# Patient Record
Sex: Male | Born: 1965 | Race: White | Hispanic: No | Marital: Married | Smoking: Current every day smoker
Health system: Southern US, Community
[De-identification: ages and names within clinical notes are randomized; demographics above are authoritative.]

## PROBLEM LIST (undated history)

## (undated) DIAGNOSIS — F1010 Alcohol abuse, uncomplicated: Secondary | ICD-10-CM

## (undated) DIAGNOSIS — I2510 Atherosclerotic heart disease of native coronary artery without angina pectoris: Secondary | ICD-10-CM

## (undated) DIAGNOSIS — Z720 Tobacco use: Secondary | ICD-10-CM

## (undated) DIAGNOSIS — I10 Essential (primary) hypertension: Secondary | ICD-10-CM

## (undated) HISTORY — PX: SHOULDER SURGERY: SHX246

## (undated) HISTORY — DX: Alcohol abuse, uncomplicated: F10.10

---

## 2003-03-09 ENCOUNTER — Emergency Department (HOSPITAL_COMMUNITY): Admission: EM | Admit: 2003-03-09 | Discharge: 2003-03-09 | Payer: Self-pay | Admitting: Emergency Medicine

## 2003-03-09 ENCOUNTER — Encounter: Payer: Self-pay | Admitting: Emergency Medicine

## 2004-10-01 ENCOUNTER — Observation Stay (HOSPITAL_COMMUNITY): Admission: RE | Admit: 2004-10-01 | Discharge: 2004-10-02 | Payer: Self-pay | Admitting: Specialist

## 2008-04-20 ENCOUNTER — Emergency Department (HOSPITAL_COMMUNITY): Admission: EM | Admit: 2008-04-20 | Discharge: 2008-04-20 | Payer: Self-pay | Admitting: Family Medicine

## 2009-05-05 ENCOUNTER — Ambulatory Visit (HOSPITAL_COMMUNITY): Admission: RE | Admit: 2009-05-05 | Discharge: 2009-05-05 | Payer: Self-pay | Admitting: Gastroenterology

## 2009-05-05 ENCOUNTER — Encounter (INDEPENDENT_AMBULATORY_CARE_PROVIDER_SITE_OTHER): Payer: Self-pay | Admitting: Gastroenterology

## 2011-03-05 NOTE — Op Note (Signed)
NAME:  Roger Davis, Roger Davis NO.:  [id number]   MEDICAL RECORD NO.:  [id number]          PATIENT TYPE:  AMB   LOCATION:  ENDO                         FACILITY:  [facility]   PHYSICIAN:  Shirley Friar, MDDATE OF BIRTH:  1966-10-10   DATE OF PROCEDURE:  05/05/2009  DATE OF DISCHARGE:                               OPERATIVE REPORT   PROCEDURE:  Colonoscopy.   ENDOSCOPIST:  Shirley Friar, M.D.   INDICATIONS FOR PROCEDURE:  Screening and a family history of colon  cancer in his father.   MEDICATIONS:  Fentanyl 100 mcg IV, Versed 12.5 mg IV, Phenergan 12.5 mg  IV.   DESCRIPTION OF PROCEDURE/FINDINGS:  The rectal exam was normal.  The  pediatric Pentax colonoscope was inserted into a well-prepped colon and  advanced to the cecum where the ileocecal valve and appendiceal orifice  were identified.  In the transverse colon, there was an 8 mm sessile  polyp, that was removed with snare and cautery.  No other mucosal  abnormalities were seen.  Retroflexion was unremarkable.   ASSESSMENT:  Colon polyp, status post snare and cautery, otherwise  normal colonoscopy.   PLAN:  1. Follow up on pathology.  2. No aspirin products for 14 days.  3. Repeat colonoscopy in five years.      Shirley Friar, MD  Electronically Signed     VCS/MEDQ  D:  05/05/2009  T:  05/05/2009  Job:  [id number]

## 2011-03-08 NOTE — Op Note (Signed)
NAME:  JASN, XIA NO.:  [id number]   MEDICAL RECORD NO.:  [id number]          PATIENT TYPE:  AMB   LOCATION:  DAY                          FACILITY:  [facility]   PHYSICIAN:  Jene Every, M.D.    DATE OF BIRTH:  October 18, 1966   DATE OF PROCEDURE:  10/01/2004  DATE OF DISCHARGE:                                 OPERATIVE REPORT   PREOPERATIVE DIAGNOSIS:  Dislocated right acromioclavicular joint.   POSTOPERATIVE DIAGNOSIS:  Dislocated right acromioclavicular joint.   PROCEDURE PERFORMED:  Open reduction of the distal clavicle joint, distal  clavicle resection, ligament reconstruction of the acromioclavicular joint.  Coracoacromial ligament fixation and repair.   ANESTHESIA:  General.   SURGEON:  Jene Every, M.D.   ASSISTANT:  Marlowe Kays, M.D.   BRIEF HISTORY/INDICATIONS:  This is a 45 year old who sustained a  dislocation of the [facility] joint with progressive cephalad migration with pain,  instability, and intermittent tenting of the skin.  Operative intervention  was indicated for repair of the torn ligament and relocation of the [facility]  joint.  Discussed distal clavicle resection, redirection of the CA ligament  into the distal clavicle as well as the use of the Mitek suture.  Anchors at  the base of the coracoid, as described recently, for augmentation of the  fixation of the coracoacromial ligaments.  Risks and benefits were  discussed, including bleeding, infection, damage to vascular structures,  recurrent tear, recurrent dislocation, anesthetic complication, etc.   TECHNIQUE:  With the patient in the supine beach-chair position after  adequate induction of general anesthesia and 1 gm of Kefzol, the right  shoulder and upper extremity was prepped and draped in the usual sterile  fashion.  An anterior curvilinear incision was then made over the [facility] joint,  anterior aspect of the clavicle.  Subcutaneous tissue was incised,  electrocautery was utilized to  achieve hemostasis.  The clavicle was found  to be piercing through the deltopectoral fascia.  We incised the [facility] joint.  Clear fluid was evacuated.  Meniscus was found to be dislocated.  The  meniscus was removed.  I superiorly ostially elevated the periosteum over  the distal clavicle.  Oscillating saw utilized and removed 1 cm to the  distal clavicle.  Next, the anterior deltoid was also found to be torn as  well.  The CA ligament was identified, detached from the anterior aspect of  the acromion, and a mattress suture placed in its distal aspect.  It was  freely mobilized, and the attachment at the base of the coracoid was  preserved.  I curetted a hollow in the distal clavicle.  Two drill holes  were placed in the anterior cortex superiorly of the distal clavicle.  We  had enough of the CA ligament that we could deliver it into the distal  clavicle with the clavicle reduced to its appropriate position.   Next, we identified the base of the coracoid, its medial and lateral extent,  as well as its base and its anterior extent.  The torn coracoacromial  ligaments were appreciated as well.  I placed a  drill hole through the  anterior cortex at the base of the coracoid in two separate areas,  approximately 1 cm apart, and inserted a Mitek suture anchor without  difficulty, buried the metal implant, and were still able to visualize the  metal implant.  After release from its delivery device, we pulled on both of  the sutures.  We were unable to displace the suture anchor, but again, could  visualize the suture anchor on both.  Therefore, we felt that this did not  indicate it had migrated to the other side of the coracoid.  This was at its  base.  We drilled a hole through the clavicle right above the coracoid at  its base, more towards the anterior aspect of the clavicle with a drill.  I  passed a suture limb from each of the Mitek suture anchors through that  center hole.  One of the  Ethibond sutures from the Mitek was placed  posterior to the clavicle, and the other anterior.  Next, the wound was  copiously irrigated.  We then held the clavicle in reduction such that the  cephalad edge of the clavicle was flushed with the cephalad edge of the  acromion.  We delivered the CA ligament into the distal clavicle and tied it  anteriorly with excellent purchase.  We then sutured the limbs of the Mitek  suture anchors anteriorly and posteriorly with excellent purchase.  We then  over-sewed some ends of the coracoacromial ligaments anteriorly to the torn  portion at the clavicle.  Following this, I repaired the capsule over the [facility]  joint with #1 Vicryl figure-of-eight interrupted sutures.  I also repaired  the deltotrapezial fascia.  The periosteum of the clavicle was repaired as  well as well as the anterior deltoid attachment on the anterior aspect of  the clavicle.  Excellent repair was noted.  Range of the shoulder without  change in the position of the clavicle.  The wound was copiously irrigated.  Subcutaneous tissue was reapproximated was 2-0 Vicryl sutures.  The skin was  reapproximated with staples.  Obtained an intraoperative radiograph digital  film, somewhat from an oblique view.  The clavicle appeared to be level.  Difficult to see the anchors of the Mitek suture anchor, but again, we felt  we visualized that intraoperatively and felt it was well within the base of  the coracoid.  Next, the sterile dressing was applied.  He was placed in an  abduction pillow, extubated without difficulty, and transported to the  recovery room in satisfactory condition.  Patient had tolerated the  procedure well.  There were no complications.  Blood loss was 10 cc.     Trey Paula   JB/MEDQ  D:  10/01/2004  T:  10/01/2004  Job:  [id number]

## 2014-09-11 ENCOUNTER — Encounter (HOSPITAL_COMMUNITY): Payer: Self-pay

## 2014-09-11 ENCOUNTER — Emergency Department (HOSPITAL_COMMUNITY)
Admission: EM | Admit: 2014-09-11 | Discharge: 2014-09-11 | Disposition: A | Discharge status: Home / Self Care | Payer: PRIVATE HEALTH INSURANCE | Attending: Emergency Medicine | Admitting: Emergency Medicine

## 2014-09-11 DIAGNOSIS — R000 Tachycardia, unspecified: Secondary | ICD-10-CM | POA: Diagnosis not present

## 2014-09-11 DIAGNOSIS — L02215 Cutaneous abscess of perineum: Secondary | ICD-10-CM | POA: Insufficient documentation

## 2014-09-11 DIAGNOSIS — I10 Essential (primary) hypertension: Secondary | ICD-10-CM | POA: Insufficient documentation

## 2014-09-11 DIAGNOSIS — Z720 Tobacco use: Secondary | ICD-10-CM | POA: Insufficient documentation

## 2014-09-11 DIAGNOSIS — K6289 Other specified diseases of anus and rectum: Secondary | ICD-10-CM | POA: Diagnosis present

## 2014-09-11 HISTORY — DX: Essential (primary) hypertension: I10

## 2014-09-11 LAB — BASIC METABOLIC PANEL
Anion gap: 16 — ABNORMAL HIGH (ref 5–15)
BUN: 8 mg/dL (ref 6–23)
CO2: 23 mEq/L (ref 19–32)
Calcium: 9.5 mg/dL (ref 8.4–10.5)
Chloride: 95 mEq/L — ABNORMAL LOW (ref 96–112)
Creatinine, Ser: 0.7 mg/dL (ref 0.50–1.35)
GFR calc Af Amer: >90 mL/min (ref >90)
GFR calc non Af Amer: >90 mL/min (ref >90)
Glucose, Bld: 89 mg/dL (ref 70–99)
Potassium: 4.3 mEq/L (ref 3.7–5.3)
Sodium: 134 mEq/L — ABNORMAL LOW (ref 137–147)

## 2014-09-11 LAB — CBC WITH DIFFERENTIAL/PLATELET
Basophils Absolute: 0.1 10*3/uL (ref 0.0–0.1)
Basophils Relative: 1 % (ref 0–1)
Eosinophils Absolute: 0.1 10*3/uL (ref 0.0–0.7)
Eosinophils Relative: 1 % (ref 0–5)
HEMATOCRIT: 42.8 % (ref 39.0–52.0)
Hemoglobin: 15.3 g/dL (ref 13.0–17.0)
Lymphocytes Relative: 13 % (ref 12–46)
Lymphs Abs: 1.4 10*3/uL (ref 0.7–4.0)
MCH: 33.2 pg (ref 26.0–34.0)
MCHC: 35.7 g/dL (ref 30.0–36.0)
MCV: 92.8 fL (ref 78.0–100.0)
Monocytes Absolute: 1.3 10*3/uL — ABNORMAL HIGH (ref 0.1–1.0)
Monocytes Relative: 12 % (ref 3–12)
NEUTROS ABS: 8.1 10*3/uL — AB (ref 1.7–7.7)
Neutrophils Relative %: 73 % (ref 43–77)
Platelets: 226 10*3/uL (ref 150–400)
RBC: 4.61 MIL/uL (ref 4.22–5.81)
RDW: 11.8 % (ref 11.5–15.5)
WBC: 10.9 10*3/uL — ABNORMAL HIGH (ref 4.0–10.5)

## 2014-09-11 MED ORDER — OXYCODONE-ACETAMINOPHEN 5-325 MG PO TABS: 1.0000 | ORAL_TABLET | Freq: Four times a day (QID) | ORAL | Status: AC | PRN

## 2014-09-11 MED ORDER — CLINDAMYCIN PHOSPHATE 300 MG/50ML IV SOLN
300.0000 mg | Freq: Once | INTRAVENOUS | Status: AC
  Administered 2014-09-11: 300 mg via INTRAVENOUS
  Filled 2014-09-11 (×2): qty 50

## 2014-09-11 MED ORDER — NAPROXEN 500 MG PO TABS: 500.0000 mg | ORAL_TABLET | Freq: Two times a day (BID) | ORAL | Status: AC | PRN

## 2014-09-11 MED ORDER — SODIUM CHLORIDE 0.9 % IV BOLUS (SEPSIS)
1000.0000 mL | Freq: Once | INTRAVENOUS | Status: AC
  Administered 2014-09-11: 1000 mL via INTRAVENOUS

## 2014-09-11 MED ORDER — CLINDAMYCIN HCL 300 MG PO CAPS: 300.0000 mg | ORAL_CAPSULE | Freq: Four times a day (QID) | ORAL | Status: DC

## 2014-09-11 MED ORDER — HYDROMORPHONE HCL 1 MG/ML IJ SOLN
1.0000 mg | Freq: Once | INTRAMUSCULAR | Status: AC
  Administered 2014-09-11: 1 mg via INTRAVENOUS
  Filled 2014-09-11: qty 1

## 2014-09-11 MED ORDER — LIDOCAINE-EPINEPHRINE (PF) 2 %-1:200000 IJ SOLN
10.0000 mL | Freq: Once | INTRAMUSCULAR | Status: AC
  Administered 2014-09-11: 10 mL
  Filled 2014-09-11: qty 20

## 2014-09-11 NOTE — ED Notes (Signed)
Pt reports a "boil" to the L perirectal area.  Pt reports it's about the size of a quarter and very painful.  Pt denies any drainage from the area.

## 2014-09-11 NOTE — ED Provider Notes (Signed)
CSN: [id number]     Arrival date & time 09/11/14  1128 History   First MD Initiated Contact with Patient 09/11/14 1305     Chief Complaint  Patient presents with  . Rectal Pain     (Consider location/radiation/quality/duration/timing/severity/associated sxs/prior Treatment) HPI Comments: Roger Davis is a 48 y.o. male with a PMHx of HTN, who presents to the ED with complaints of abscess to left buttock near perineum onset 1 week ago. He reports the pain is 8/10 pressure-like constant nonradiating pain worse with sitting or palpation to the area and improved with standing. He has attempted to perform warm soaks with no relief. He has not tried any medications. He reports the area has become fluctuant and increased in size as well as erythema, but denies any drainage or red streaking. He denies any fevers, chills, chest pain, shortness breath, abdominal pain, nausea, vomiting, diarrhea, constipation, dysuria, hematuria, groin pain, testicular pain or swelling, scrotal swelling or pain, rectal pain or pain with defecation, hematochezia, melena, or paresthesias. He has never had any prior abscesses, and denies any known injury to this area. Denies any insect bites or IV drug use. Denies diabetes or immunosuppression. Of note he did not take his hypertension medications today. PCP Lupita RaiderKimberlee Shaw.  Patient is a 48 y.o. male presenting with abscess. The history is provided by the patient. No language interpreter was used.  Abscess Location:  Ano-genital Ano-genital abscess location:  L buttock, gluteal cleft and perineum Size:  Quarter sized Abscess quality: fluctuance, painful and redness   Abscess quality: not draining, no induration, no warmth and not weeping   Red streaking: no   Duration:  1 week Progression:  Worsening Pain details:    Quality:  Pressure   Severity:  Severe (8/10)   Duration:  1 week   Timing:  Constant   Progression:  Worsening Chronicity:  New Context: not diabetes,  not immunosuppression, not injected drug use and not insect bite/sting   Relieved by: standing. Exacerbated by: sitting or palpation to area. Ineffective treatments:  Warm water soaks Associated symptoms: no anorexia, no fever, no nausea and no vomiting   Risk factors: no prior abscess     Past Medical History  Diagnosis Date  . Hypertension    Past Surgical History  Procedure Laterality Date  . Shoulder surgery     No family history on file. History  Substance Use Topics  . Smoking status: Current Every Day Smoker  . Smokeless tobacco: Not on file  . Alcohol Use: Yes    Review of Systems  Constitutional: Negative for fever and chills.  Respiratory: Negative for shortness of breath.   Cardiovascular: Negative for chest pain.  Gastrointestinal: Negative for nausea, vomiting, abdominal pain, diarrhea, constipation, blood in stool, anal bleeding, rectal pain and anorexia.  Genitourinary: Negative for dysuria, hematuria, scrotal swelling, difficulty urinating, penile pain and testicular pain.  Musculoskeletal: Negative for myalgias and arthralgias.  Skin: Positive for color change.       +abscess  Allergic/Immunologic: Negative for immunocompromised state.  Neurological: Negative for weakness and numbness.  Hematological: Does not bruise/bleed easily.   10 Systems reviewed and are negative for acute change except as noted in the HPI.    Allergies  Review of patient's allergies indicates no known allergies.  Home Medications   Prior to Admission medications   Not on File   BP 154/101 mmHg  Pulse 103  Temp(Src) 98.3 F (36.8 C)  Resp 18  Ht 5\' 10"  (1.778  m)  Wt 170 lb (77.111 kg)  BMI 24.39 kg/m2  SpO2 97% Physical Exam  Constitutional: He is oriented to person, place, and time. He appears well-developed and well-nourished.  Non-toxic appearance. He appears distressed (in pain).  Hypertension noted, mild tachycardia, appears uncomfortable but nontoxic. Afebrile.    HENT:  Head: Normocephalic and atraumatic.  Mouth/Throat: Oropharynx is clear and moist and mucous membranes are normal.  Eyes: Conjunctivae and EOM are normal. Right eye exhibits no discharge. Left eye exhibits no discharge.  Neck: Normal range of motion. Neck supple.  Cardiovascular: Regular rhythm, normal heart sounds and intact distal pulses.  Tachycardia present.  Exam reveals no gallop and no friction rub.   No murmur heard. Mildly tachycardic initially which resolved  Pulmonary/Chest: Effort normal and breath sounds normal. No respiratory distress. He has no decreased breath sounds. He has no wheezes. He has no rhonchi. He has no rales.  Abdominal: Soft. Normal appearance and bowel sounds are normal. He exhibits no distension. There is no tenderness. There is no rigidity, no rebound, no guarding, no CVA tenderness, no tenderness at McBurney's point and negative Murphy's sign.  Genitourinary: Testes normal and penis normal. Rectal exam shows no external hemorrhoid, no internal hemorrhoid, no fissure, no mass and no tenderness. Right testis shows no mass, no swelling and no tenderness. Left testis shows no mass, no swelling and no tenderness.     No gross blood on rectal exam. No rectal tenderness, masses, hemorrhoids, or fissure. Large abscess to L buttock just lateral to anus, fluctuant, approx 6cm x 3cm with erythema extending to the borders but not beyond, no surrounding cellulitis. Mildly warm, exquisitely TTP, without extension to scrotum or rectum.  Musculoskeletal: Normal range of motion.  Neurological: He is alert and oriented to person, place, and time. He has normal strength. No sensory deficit.  Skin: Skin is warm, dry and intact. No rash noted. There is erythema.  Abscess as noted above to L buttock. No drainage.  Psychiatric: He has a normal mood and affect.  Nursing note and vitals reviewed.   ED Course  INCISION AND DRAINAGE Date/Time: 09/11/2014 2:36 PM Performed by:  Marjean DonnaAMPRUBI-SOMS, Ileta Ofarrell STRUPP Authorized by: Ramond MarrowAMPRUBI-SOMS, Suetta Hoffmeister STRUPP Consent: Verbal consent obtained. Risks and benefits: risks, benefits and alternatives were discussed Consent given by: patient Patient understanding: patient states understanding of the procedure being performed Patient consent: the patient's understanding of the procedure matches consent given Site marked: the operative site was marked Patient identity confirmed: verbally with patient Type: abscess Body area: anogenital Location details: perineum Anesthesia: local infiltration Local anesthetic: lidocaine 2% with epinephrine Anesthetic total: 7 ml Patient sedated: no Scalpel size: 11 Needle gauge: 22 Incision type: single straight Complexity: simple Drainage: purulent Drainage amount: moderate Wound treatment: wound left open Packing material: none Patient tolerance: Patient tolerated the procedure well with no immediate complications   (including critical care time) Labs Review Labs Reviewed  CBC WITH DIFFERENTIAL - Abnormal; Notable for the following:    WBC 10.9 (*)    Neutro Abs 8.1 (*)    Monocytes Absolute 1.3 (*)    All other components within normal limits  BASIC METABOLIC PANEL - Abnormal; Notable for the following:    Sodium 134 (*)    Chloride 95 (*)    Anion gap 16 (*)    All other components within normal limits  WOUND CULTURE    Imaging Review No results found.   EKG Interpretation None      MDM  Final diagnoses:  Abscess of superficial perineal space  HTN (hypertension), benign    48y/o male with abscess near perineum/L buttock. Fairly well circumscribed, very fluctuant, doesn't seem to extend into rectum or near scrotal area. Concerned for perirectal abscess vs fornier's gangrene, therefore discussed case with Dr. Rubin Payor who agrees that this is abscess and doesn't seem to be deeper space infection or more concerning abscess. Will proceed with obtaining basic labs,  give pain meds, and I&D with culture. Will give IV abx now and d/c home with abx.   3:06 PM CBC w/diff showing mild leukocytosis of neutrophilic predominance. BMP showing mildly low Na and Cl, therefore will give IVFs with clinda dose. I&D with moderate amount of purulent drainage, and pain improved drastically with procedure as well as dilaudid. Left wound open without packing due to concern of contamination from fecal material. Wound culture sent. Pain improvement causing HTN to resolve and tachycardia to improve. Doubt need for admission or surgery consult. Discussed warm sitz baths, wound care, pain control, and wound check in 2 days at PCP's office. Will rx clindamycin for home. Will give pain meds for home. Awaiting IV abx to finish, and nursing staff will discharge afterwards. I explained the diagnosis and have given explicit precautions to return to the ER including for any other new or worsening symptoms. The patient understands and accepts the medical plan as it's been dictated and I have answered their questions. Discharge instructions concerning home care and prescriptions have been given. The patient is STABLE and is discharged to home in good condition.  BP 150/92 mmHg  Pulse 90  Temp(Src) 98.3 F (36.8 C)  Resp 18  Ht 5\' 10"  (1.778 m)  Wt 170 lb (77.111 kg)  BMI 24.39 kg/m2  SpO2 98%  Meds ordered this encounter  Medications  . simvastatin (ZOCOR) 20 MG tablet    Sig: Take 20 mg by mouth daily.  Marland Kitchen atenolol (TENORMIN) 50 MG tablet    Sig: Take 50 mg by mouth daily.  Marland Kitchen lisinopril (PRINIVIL,ZESTRIL) 20 MG tablet    Sig: Take 20 mg by mouth daily.  Marland Kitchen HYDROmorphone (DILAUDID) injection 1 mg    Sig:   . lidocaine-EPINEPHrine (XYLOCAINE W/EPI) 2 %-1:200000 (PF) injection 10 mL    Sig:   . clindamycin (CLEOCIN) IVPB 300 mg    Sig:     Order Specific Question:  Antibiotic Indication:    Answer:  Cellulitis  . sodium chloride 0.9 % bolus 1,000 mL    Sig:      Donnita Falls  Camprubi-Soms, PA-C 09/11/14 1549  Nathan R. Rubin Payor, MD 09/11/14 [phone]

## 2014-09-11 NOTE — Discharge Instructions (Signed)
Keep wound clean and dry with gauze bandage. Apply warm compresses to affected area throughout the day, and four times daily perform warm sitz baths as described below, using warm soapy water to cleanse the area and then dry well before replacing bandage. Take antibiotic until it is finished. Take naprosyn and percocet as directed, as needed for pain but do not drive or operate machinery with pain medication use. Followup with your Primary Care doctor in 2 days for wound recheck. Monitor area for signs of infection to include, but not limited to: increasing pain, redness, drainage/pus, or swelling. Return to emergency department for emergent changing or worsening symptoms.   Perianal Abscess An abscess is an infected area that contains a collection of pus and debris. A perianal abscess is one that occurs in the perineal area, which is the area between the anus and the scrotum in males and between the anus and the vagina in females. Perianal abscesses can vary in size. Without treatment, a perianal abscess can become larger and cause other problems. CAUSES  Glands in the perineal area can become plugged up with debris. When this happens, an abscess may form.  SIGNS AND SYMPTOMS  The most common symptoms of a perianal abscess are:  Swelling and redness in the area of the abscess. The redness may go beyond the abscess and appear as a red streak on the skin.  Pain in the area of the abscess. Other possible symptoms include:   A visible lump or a lump that can be felt when touching the area and is usually painful.  Bleeding or pus-like discharge from the area.  Fever.  General weakness. DIAGNOSIS  Your health care provider will take a medical history and examine the area. This may involve examining the rectal area with a gloved hand (digital rectal exam). For women, it may require a careful vaginal exam. Sometimes, the health care provider needs to look into the rectum using a probe or  scope. TREATMENT  Treatment often requires making a cut (incision) in the abscess to drain the pus. This can sometimes be done in your health care provider's office or an emergency department after giving you medicine to numb the area (local anesthetic). For larger or deeper abscesses, surgery may be required to drain the abscess. Antibiotic medicines are sometimes given if there is infection of the surrounding tissue (cellulitis). In some cases, gauze is packed into the abscess to continue draining the area. Frequent sitz baths may be recommended to help the wound heal and to reduce the chance of the abscess coming back. HOME CARE INSTRUCTIONS   Only take over-the-counter or prescription medicines for pain, fever, or discomfort as directed by your health care provider.  Take antibiotic medicine as directed. Make sure you finish it even if you start to feel better.  If gauze is used in the abscess, follow your health care provider's instructions for removing or changing the gauze. It can usually be removed in 2-3 days.  If one or more drains have been placed in the abscess cavity, be careful not to pull at them. Your health care provider will tell you how long they need to remain in place.  Take warm sitz baths 3-4 times a day and after bowel movements. This will help reduce pain and swelling.  Keep the skin around the abscess clean and dry. Avoid cleaning the area too much.  Avoid scratching the abscess area.  Avoid using colored or perfumed toilet papers. SEEK MEDICAL CARE IF:  You have trouble having a bowel movement or passing urine.  Your pain or swelling in the affected area does not seem to be improving.  The gauze packing or the drains come out before the planned time. SEEK IMMEDIATE MEDICAL CARE IF:   You have problems moving or using your legs.  You have severe or increasing pain.  Your swelling in the affected area suddenly gets worse.  You have a large increase in  bleeding or passing of pus.  You have chills or a fever. MAKE SURE YOU:   Understand these instructions.  Will watch your condition.  Will get help right away if you are not doing well or get worse. Document Released: 11/13/2006 Document Revised: 07/28/2013 Document Reviewed: 05/19/2013 [facility] Patient Information 2015 [employer], [place]. This information is not intended to replace advice given to you by your health care provider. Make sure you discuss any questions you have with your health care provider.   Abscess Care After An abscess (also called a boil or furuncle) is an infected area that contains a collection of pus. Signs and symptoms of an abscess include pain, tenderness, redness, or hardness, or you may feel a moveable soft area under your skin. An abscess can occur anywhere in the body. The infection may spread to surrounding tissues causing cellulitis. A cut (incision) by the surgeon was made over your abscess and the pus was drained out. Gauze may have been packed into the space to provide a drain that will allow the cavity to heal from the inside outwards. The boil may be painful for 5 to 7 days. Most people with a boil do not have high fevers. Your abscess, if seen early, may not have localized, and may not have been lanced. If not, another appointment may be required for this if it does not get better on its own or with medications. HOME CARE INSTRUCTIONS   Only take over-the-counter or prescription medicines for pain, discomfort, or fever as directed by your caregiver.  When you bathe, soak and then remove gauze or iodoform packs at least daily or as directed by your caregiver. You may then wash the wound gently with mild soapy water. Repack with gauze or do as your caregiver directs. SEEK IMMEDIATE MEDICAL CARE IF:   You develop increased pain, swelling, redness, drainage, or bleeding in the wound site.  You develop signs of generalized infection including muscle aches,  chills, fever, or a general ill feeling.  An oral temperature above 102 F (38.9 C) develops, not controlled by medication. See your caregiver for a recheck if you develop any of the symptoms described above. If medications (antibiotics) were prescribed, take them as directed. Document Released: 04/25/2005 Document Revised: 12/30/2011 Document Reviewed: 12/21/2007 [facility] CenterExit[facility] Patient Information 2015 [place], [place]. This information is not intended to replace advice given to you by your health care provider. Make sure you discuss any questions you have with your health care provider.

## 2014-09-14 LAB — WOUND CULTURE

## 2014-09-15 NOTE — Progress Notes (Signed)
ED Antimicrobial Stewardship Positive Culture Follow Up   Roger Davis is an 48 y.o. male who presented to [facility] on 09[facility]11/2014 with a chief complaint of  Chief Complaint  Patient presents with  . Rectal Pain    Recent Results (from the past 720 hour(s))  Wound culture     Status: None   Collection Time: 09/11/14  2:47 PM  Result Value Ref Range Status   Specimen Description ABSCESS GROIN  Final   Special Requests NONE  Final   Gram Stain   Final    FEW WBC PRESENT,BOTH PMN AND MONONUCLEAR NO SQUAMOUS EPITHELIAL CELLS SEEN FEW GRAM NEGATIVE RODS Performed at [facility]    Culture   Final    MODERATE ESCHERICHIA COLI Performed at [facility]    Report Status 09/14/2014 FINAL  Final   Organism ID, Bacteria ESCHERICHIA COLI  Final      Susceptibility   Escherichia coli - MIC*    AMPICILLIN 8 SENSITIVE Sensitive     AMPICILLIN/SULBACTAM 4 SENSITIVE Sensitive     CEFAZOLIN <=4 SENSITIVE Sensitive     CEFEPIME <=1 SENSITIVE Sensitive     CEFTAZIDIME <=1 SENSITIVE Sensitive     CEFTRIAXONE <=1 SENSITIVE Sensitive     CIPROFLOXACIN <=0.25 SENSITIVE Sensitive     GENTAMICIN <=1 SENSITIVE Sensitive     IMIPENEM <=0.25 SENSITIVE Sensitive     PIP/TAZO <=4 SENSITIVE Sensitive     TOBRAMYCIN <=1 SENSITIVE Sensitive     TRIMETH/SULFA <=20 SENSITIVE Sensitive     * MODERATE ESCHERICHIA COLI    [x]  Treated with clindamycin, organism resistant to prescribed antimicrobial []  Patient discharged originally without antimicrobial agent and treatment is now indicated  New antibiotic prescription: cephalexin 500mg  PO QID x 7 days  ED Provider: Sharilyn SitesLisa Sanders, PA-C   Mickeal SkinnerFrens, [name] 09/15/2014, 9:34 AM Infectious Diseases Pharmacist Phone# [phone]

## 2014-09-17 ENCOUNTER — Telehealth: Payer: Self-pay | Admitting: Emergency Medicine

## 2014-09-20 ENCOUNTER — Telehealth (HOSPITAL_BASED_OUTPATIENT_CLINIC_OR_DEPARTMENT_OTHER): Payer: Self-pay | Admitting: Emergency Medicine

## 2014-09-20 NOTE — Telephone Encounter (Signed)
Post ED Visit - Positive Culture Follow-up: Successful Patient Follow-Up  Culture assessed and recommendations reviewed by: []  Wes Dulaney, Pharm.D., BCPS [x]  Celedonio MiyamotoJeremy [name], Pharm.D., BCPS []  Georgina PillionElizabeth Martin, Pharm.D., BCPS []  DuncanMinh Pham, [address] Rainbow BoulevardPharm.D., BCPS, AAHIVP []  Estella HuskMichelle Turner, Pharm.D., BCPS, AAHIVP []  Red ChristiansSamson Lee, Pharm.D. []  Cassie New Marsh[place]Stewart, [place]arm.D.  Positive wound culture  []  Patient discharged without antimicrobial prescription and treatment is now indicated [x]  Organism is resistant to prescribed ED discharge antimicrobial []  Patient with positive blood cultures  Changes discussed with ED provider: Sharilyn SitesLisa Sanders PA New antibiotic prescription Cephalexin 500mg  po qid x 7 days [facility] [address]  Geradine Girtontacted patient, date 09/20/14, time 1047   Berle MullMiller, Sayaka Hoeppner 09/20/2014, 10:46 AM

## 2015-01-29 ENCOUNTER — Encounter (HOSPITAL_COMMUNITY)
Admission: EM | Disposition: A | Discharge status: Home / Self Care | Payer: Self-pay | Source: Home / Self Care | Attending: Cardiovascular Disease

## 2015-01-29 ENCOUNTER — Inpatient Hospital Stay (HOSPITAL_COMMUNITY)
Admission: EM | Admit: 2015-01-29 | Discharge: 2015-01-30 | DRG: 287 | Disposition: A | Discharge status: Home / Self Care | Payer: PRIVATE HEALTH INSURANCE | Attending: Cardiovascular Disease | Admitting: Cardiovascular Disease

## 2015-01-29 ENCOUNTER — Encounter (HOSPITAL_COMMUNITY): Payer: Self-pay | Admitting: Emergency Medicine

## 2015-01-29 ENCOUNTER — Emergency Department (HOSPITAL_COMMUNITY): Payer: PRIVATE HEALTH INSURANCE

## 2015-01-29 ENCOUNTER — Other Ambulatory Visit: Payer: Self-pay

## 2015-01-29 DIAGNOSIS — I213 ST elevation (STEMI) myocardial infarction of unspecified site: Secondary | ICD-10-CM

## 2015-01-29 DIAGNOSIS — R52 Pain, unspecified: Secondary | ICD-10-CM | POA: Diagnosis present

## 2015-01-29 DIAGNOSIS — R0789 Other chest pain: Secondary | ICD-10-CM | POA: Diagnosis present

## 2015-01-29 DIAGNOSIS — R072 Precordial pain: Secondary | ICD-10-CM | POA: Diagnosis not present

## 2015-01-29 DIAGNOSIS — Z720 Tobacco use: Secondary | ICD-10-CM | POA: Diagnosis present

## 2015-01-29 DIAGNOSIS — M25462 Effusion, left knee: Secondary | ICD-10-CM

## 2015-01-29 DIAGNOSIS — I2510 Atherosclerotic heart disease of native coronary artery without angina pectoris: Principal | ICD-10-CM | POA: Diagnosis present

## 2015-01-29 DIAGNOSIS — F17210 Nicotine dependence, cigarettes, uncomplicated: Secondary | ICD-10-CM | POA: Diagnosis present

## 2015-01-29 DIAGNOSIS — R9431 Abnormal electrocardiogram [ECG] [EKG]: Secondary | ICD-10-CM | POA: Diagnosis present

## 2015-01-29 DIAGNOSIS — L03116 Cellulitis of left lower limb: Secondary | ICD-10-CM

## 2015-01-29 DIAGNOSIS — L03115 Cellulitis of right lower limb: Secondary | ICD-10-CM

## 2015-01-29 DIAGNOSIS — I10 Essential (primary) hypertension: Secondary | ICD-10-CM | POA: Diagnosis present

## 2015-01-29 DIAGNOSIS — R079 Chest pain, unspecified: Secondary | ICD-10-CM | POA: Diagnosis present

## 2015-01-29 HISTORY — DX: Tobacco use: Z72.0

## 2015-01-29 HISTORY — PX: LEFT HEART CATHETERIZATION WITH CORONARY ANGIOGRAM: SHX5451

## 2015-01-29 HISTORY — DX: Atherosclerotic heart disease of native coronary artery without angina pectoris: I25.10

## 2015-01-29 LAB — CBC WITH DIFFERENTIAL/PLATELET
Basophils Absolute: 0 10*3/uL (ref 0.0–0.1)
Basophils Relative: 0 % (ref 0–1)
EOS PCT: 1 % (ref 0–5)
Eosinophils Absolute: 0.1 10*3/uL (ref 0.0–0.7)
HEMATOCRIT: 41.2 % (ref 39.0–52.0)
Hemoglobin: 15 g/dL (ref 13.0–17.0)
Lymphocytes Relative: 12 % (ref 12–46)
Lymphs Abs: 1.8 10*3/uL (ref 0.7–4.0)
MCH: 33.7 pg (ref 26.0–34.0)
MCHC: 36.4 g/dL — ABNORMAL HIGH (ref 30.0–36.0)
MCV: 92.6 fL (ref 78.0–100.0)
Monocytes Absolute: 1.3 10*3/uL — ABNORMAL HIGH (ref 0.1–1.0)
Monocytes Relative: 9 % (ref 3–12)
NEUTROS PCT: 78 % — AB (ref 43–77)
Neutro Abs: 11.4 10*3/uL — ABNORMAL HIGH (ref 1.7–7.7)
Platelets: 230 10*3/uL (ref 150–400)
RBC: 4.45 MIL/uL (ref 4.22–5.81)
RDW: 11.9 % (ref 11.5–15.5)
WBC: 14.6 10*3/uL — AB (ref 4.0–10.5)

## 2015-01-29 LAB — BASIC METABOLIC PANEL
Anion gap: 14 (ref 5–15)
BUN: 7 mg/dL (ref 6–23)
CALCIUM: 8.7 mg/dL (ref 8.4–10.5)
CO2: 21 mmol/L (ref 19–32)
Chloride: 91 mmol/L — ABNORMAL LOW (ref 96–112)
Creatinine, Ser: 0.64 mg/dL (ref 0.50–1.35)
GFR calc Af Amer: >90 mL/min (ref >90)
GFR calc non Af Amer: >90 mL/min (ref >90)
Glucose, Bld: 148 mg/dL — ABNORMAL HIGH (ref 70–99)
Potassium: 3.7 mmol/L (ref 3.5–5.1)
Sodium: 126 mmol/L — ABNORMAL LOW (ref 135–145)

## 2015-01-29 LAB — PROTIME-INR
INR: 1 (ref 0.00–1.49)
Prothrombin Time: 13.3 seconds (ref 11.6–15.2)

## 2015-01-29 LAB — I-STAT TROPONIN, ED: Troponin i, poc: 0 ng/mL (ref 0.00–0.08)

## 2015-01-29 LAB — APTT: APTT: 27 s (ref 24–37)

## 2015-01-29 LAB — TROPONIN I: Troponin I: <0.03 ng/mL (ref <0.031)

## 2015-01-29 SURGERY — LEFT HEART CATHETERIZATION WITH CORONARY ANGIOGRAM
Anesthesia: LOCAL

## 2015-01-29 MED ORDER — ASPIRIN 81 MG PO CHEW
81.0000 mg | CHEWABLE_TABLET | Freq: Every day | ORAL | Status: DC
  Administered 2015-01-30: 81 mg via ORAL
  Filled 2015-01-29: qty 1

## 2015-01-29 MED ORDER — ATENOLOL 25 MG PO TABS
50.0000 mg | ORAL_TABLET | Freq: Every day | ORAL | Status: DC
  Administered 2015-01-30: 50 mg via ORAL
  Filled 2015-01-29: qty 2

## 2015-01-29 MED ORDER — ONDANSETRON HCL 4 MG/2ML IJ SOLN: 4.0000 mg | Freq: Four times a day (QID) | INTRAMUSCULAR | Status: DC | PRN

## 2015-01-29 MED ORDER — HEPARIN (PORCINE) IN NACL 2-0.9 UNIT/ML-% IJ SOLN
INTRAMUSCULAR | Status: AC
Start: 2015-01-29 — End: 2015-01-29
  Filled 2015-01-29: qty 1000

## 2015-01-29 MED ORDER — NITROGLYCERIN 1 MG/10 ML FOR IR/CATH LAB
INTRA_ARTERIAL | Status: AC
  Filled 2015-01-29: qty 10

## 2015-01-29 MED ORDER — HEPARIN SODIUM (PORCINE) 5000 UNIT/ML IJ SOLN: 4000.0000 [IU] | INTRAMUSCULAR | Status: DC

## 2015-01-29 MED ORDER — SIMVASTATIN 20 MG PO TABS
20.0000 mg | ORAL_TABLET | Freq: Every day | ORAL | Status: DC
  Administered 2015-01-30: 20 mg via ORAL
  Filled 2015-01-29: qty 1

## 2015-01-29 MED ORDER — MIDAZOLAM HCL 2 MG/2ML IJ SOLN
INTRAMUSCULAR | Status: AC
  Filled 2015-01-29: qty 2

## 2015-01-29 MED ORDER — VERAPAMIL HCL 2.5 MG/ML IV SOLN
INTRAVENOUS | Status: AC
  Filled 2015-01-29: qty 2

## 2015-01-29 MED ORDER — FENTANYL CITRATE 0.05 MG/ML IJ SOLN
INTRAMUSCULAR | Status: AC
  Filled 2015-01-29: qty 2

## 2015-01-29 MED ORDER — SODIUM CHLORIDE 0.9 % IV SOLN: INTRAVENOUS | Status: AC

## 2015-01-29 MED ORDER — LISINOPRIL 20 MG PO TABS
20.0000 mg | ORAL_TABLET | Freq: Every day | ORAL | Status: DC
  Administered 2015-01-30: 20 mg via ORAL
  Filled 2015-01-29: qty 1

## 2015-01-29 MED ORDER — OXYCODONE-ACETAMINOPHEN 5-325 MG PO TABS: 1.0000 | ORAL_TABLET | ORAL | Status: DC | PRN

## 2015-01-29 MED ORDER — ACETAMINOPHEN 325 MG PO TABS: 650.0000 mg | ORAL_TABLET | ORAL | Status: DC | PRN

## 2015-01-29 MED ORDER — LIDOCAINE HCL (PF) 1 % IJ SOLN
INTRAMUSCULAR | Status: AC
  Filled 2015-01-29: qty 30

## 2015-01-29 MED ORDER — ASPIRIN 325 MG PO TABS
325.0000 mg | ORAL_TABLET | Freq: Once | ORAL | Status: AC
  Administered 2015-01-29: 325 mg via ORAL
  Filled 2015-01-29: qty 1

## 2015-01-29 MED ORDER — HEPARIN SODIUM (PORCINE) 1000 UNIT/ML IJ SOLN
INTRAMUSCULAR | Status: AC
  Filled 2015-01-29: qty 1

## 2015-01-29 MED ORDER — SODIUM CHLORIDE 0.9 % IV SOLN: INTRAVENOUS | Status: DC

## 2015-01-29 NOTE — ED Notes (Signed)
Bed: [facility] Expected date:  Expected time:  Means of arrival:  Comments: 

## 2015-01-29 NOTE — H&P (Addendum)
Patient ID: Roger EarthlyJohn K Davis MRN: [id number] DOB/AGE: 1966-06-04 49 y.o. Admit date: 01/29/2015  Primary Cardiologist: New  HPI:  49 yo male with history of tobacco abuse, HTN with onset of chest pain this am. [facility] and EKG showed 1mm ST elevation in the inferior leads. No chest pain or SOB at this time. No prior cardiac disease. He has smoked 1ppd for 30 years.   Review of systems complete and found to be negative unless listed above   Past Medical History  Diagnosis Date  . Hypertension   . Tobacco abuse     Family History  Problem Relation Age of Onset  . Diabetes Father     History   Social History  . Marital Status: Married    Spouse Name: N/A  . Number of Children: N/A  . Years of Education: N/A   Occupational History  . Not on file.   Social History Main Topics  . Smoking status: Current Every Day Smoker -- 1.00 packs/day for 30 years    Types: Cigarettes  . Smokeless tobacco: Not on file  . Alcohol Use: 0.0 oz/week    0 Standard drinks or equivalent per week  . Drug Use: No  . Sexual Activity: Not on file   Other Topics Concern  . Not on file   Social History Narrative    Past Surgical History  Procedure Laterality Date  . Shoulder surgery      No Known Allergies  Prior to Admission Meds:  Prior to Admission medications   Medication Sig Start Date End Date Taking? Authorizing Provider  atenolol (TENORMIN) 50 MG tablet Take 50 mg by mouth daily.   Yes Historical Provider, MD  lisinopril (PRINIVIL,ZESTRIL) 20 MG tablet Take 20 mg by mouth daily.   Yes Historical Provider, MD  simvastatin (ZOCOR) 20 MG tablet Take 20 mg by mouth daily.   Yes Historical Provider, MD  clindamycin (CLEOCIN) 300 MG capsule Take 1 capsule (300 mg total) by mouth 4 (four) times daily. X 7 days Patient not taking: Reported on 01/29/2015 09/11/14   Mercedes Camprubi-Soms, PA-C  naproxen (NAPROSYN) 500 MG tablet Take 1 tablet (500 mg total) by  mouth 2 (two) times daily as needed for mild pain, moderate pain or headache (TAKE WITH MEALS.). Patient not taking: Reported on 01/29/2015 09/11/14   Mercedes Camprubi-Soms, PA-C  oxyCODONE-acetaminophen (PERCOCET) 5-325 MG per tablet Take 1-2 tablets by mouth every 6 (six) hours as needed for severe pain. Patient not taking: Reported on 01/29/2015 09/11/14   Allen DerryMercedes Camprubi-Soms, PA-C    Physical Exam: Blood pressure 114/94, pulse 92, temperature 98.3 F (36.8 C), temperature source Oral, resp. rate 18, height 5\' 7"  (1.702 m), weight 165 lb (74.844 kg), SpO2 92 %.    General: Well developed, well nourished, NAD  HEENT: OP clear, mucus membranes moist  SKIN: warm, dry. No rashes.  Neuro: No focal deficits  Musculoskeletal: Muscle strength 5/5 all ext  Psychiatric: Mood and affect normal  Neck: No JVD, no carotid bruits, no thyromegaly, no lymphadenopathy.  Lungs:Clear bilaterally, no wheezes, rhonci, crackles  Cardiovascular: Regular rate and rhythm. No murmurs, gallops or rubs.  Abdomen:Soft. Bowel sounds present. Non-tender.  Extremities: No lower extremity edema. Pulses are 2 + in the bilateral DP/PT.  Labs:   Lab Results  Component Value Date   WBC 14.6* 01/29/2015   HGB 15.0 01/29/2015   HCT 41.2 01/29/2015   MCV 92.6 01/29/2015   PLT  230 01/29/2015   EKG: sinus, 1 mm ST elevation inferior leads  ASSESSMENT AND PLAN:   1. Chest pain, precordial: Subtle ST elevation in the inferior leads. Cardiac cath with non-obstructive CAD. Will admit to telemetry unit and continue ASA, statin, beta blocker and Ace-inh. Tobacco cessation. Discharge in am if stable.    Earney Hamburg, MD 01/29/2015, 5:28 PM

## 2015-01-29 NOTE — ED Notes (Signed)
Pt c/o left knee pain, heat and swelling that started yesterday. Pt states that he was going to lunch today and got very nauseated and girlfriend recommended that he come be checked out.

## 2015-01-29 NOTE — ED Provider Notes (Signed)
CSN: [id number]     Arrival date & time 01/29/15  1614 History   First MD Initiated Contact with Patient 01/29/15 1636     Chief Complaint  Patient presents with  . Joint Swelling  . Knee Pain     (Consider location/radiation/quality/duration/timing/severity/associated sxs/prior Treatment) HPI Comments: Pt has secondary complaint of R knee pain/swelling for 1 day. He has BL knee abrasions from last week. He has hx of gout, but of the feet/ankles, no knees.   Patient is a 49 y.o. male presenting with chest pain. The history is provided by the patient. No language interpreter was used.  Chest Pain Pain location:  L lateral chest Pain quality: aching   Pain radiates to:  Does not radiate Pain radiates to the back: no   Pain severity:  Moderate Onset quality:  Sudden Duration:  20 minutes Timing:  Constant Progression:  Resolved Chronicity:  New Context: at rest   Relieved by:  Nothing Worsened by:  Nothing tried Ineffective treatments:  None tried Associated symptoms: no abdominal pain, no back pain, no cough, no dizziness, no dysphagia, no fatigue, no fever, no headache, no nausea, no numbness, no shortness of breath, not vomiting and no weakness   Associated symptoms comment:  Around 12pm he had a near syncopal episode described as sudden onset redness in the face, nausea, near syncope. Did not have chest pain with this episode Risk factors: hypertension, male sex and smoking     Past Medical History  Diagnosis Date  . Hypertension   . Tobacco abuse    Past Surgical History  Procedure Laterality Date  . Shoulder surgery     Family History  Problem Relation Age of Onset  . Diabetes Father    History  Substance Use Topics  . Smoking status: Current Every Day Smoker -- 1.00 packs/day for 30 years    Types: Cigarettes  . Smokeless tobacco: Not on file  . Alcohol Use: 0.0 oz/week    0 Standard drinks or equivalent per week    Review of Systems  Constitutional:  Negative for fever, activity change, appetite change and fatigue.  HENT: Negative for congestion, facial swelling, rhinorrhea and trouble swallowing.   Eyes: Negative for photophobia and pain.  Respiratory: Negative for cough, chest tightness and shortness of breath.   Cardiovascular: Positive for chest pain. Negative for leg swelling.  Gastrointestinal: Negative for nausea, vomiting, abdominal pain, diarrhea and constipation.  Endocrine: Negative for polydipsia and polyuria.  Genitourinary: Negative for dysuria, urgency, decreased urine volume and difficulty urinating.  Musculoskeletal: Positive for arthralgias. Negative for back pain and gait problem.  Skin: Negative for color change, rash and wound.  Allergic/Immunologic: Negative for immunocompromised state.  Neurological: Negative for dizziness, facial asymmetry, speech difficulty, weakness, numbness and headaches.  Psychiatric/Behavioral: Negative for confusion, decreased concentration and agitation.      Allergies  Review of patient's allergies indicates no known allergies.  Home Medications   Prior to Admission medications   Medication Sig Start Date End Date Taking? Authorizing Provider  atenolol (TENORMIN) 50 MG tablet Take 50 mg by mouth daily.   Yes Historical Provider, MD  lisinopril (PRINIVIL,ZESTRIL) 20 MG tablet Take 20 mg by mouth daily.   Yes Historical Provider, MD  simvastatin (ZOCOR) 20 MG tablet Take 20 mg by mouth daily.   Yes Historical Provider, MD  clindamycin (CLEOCIN) 300 MG capsule Take 1 capsule (300 mg total) by mouth 4 (four) times daily. X 7 days Patient not taking: Reported on 01/29/2015  09/11/14   Mercedes Camprubi-Soms, PA-C  naproxen (NAPROSYN) 500 MG tablet Take 1 tablet (500 mg total) by mouth 2 (two) times daily as needed for mild pain, moderate pain or headache (TAKE WITH MEALS.). Patient not taking: Reported on 01/29/2015 09/11/14   Mercedes Camprubi-Soms, PA-C  oxyCODONE-acetaminophen (PERCOCET)  5-325 MG per tablet Take 1-2 tablets by mouth every 6 (six) hours as needed for severe pain. Patient not taking: Reported on 01/29/2015 09/11/14   Mercedes Camprubi-Soms, PA-C   BP 102/61 mmHg  Pulse 81  Temp(Src) 98.9 F (37.2 C) (Oral)  Resp 16  Ht  (1.778 m)  Wt 171 lb 4.8 oz (77.7 kg)  BMI 24.58 kg/m2  SpO2 93% Physical Exam  Constitutional: He is oriented to person, place, and time. He appears well-developed and well-nourished. No distress.  HENT:  Head: Normocephalic and atraumatic.  Mouth/Throat: No oropharyngeal exudate.  Eyes: Pupils are equal, round, and reactive to light.  Neck: Normal range of motion. Neck supple.  Cardiovascular: Normal rate, regular rhythm and normal heart sounds.  Exam reveals no gallop and no friction rub.   No murmur heard. Pulmonary/Chest: Effort normal and breath sounds normal. No respiratory distress. He has no wheezes. He has no rales.  Abdominal: Soft. Bowel sounds are normal. He exhibits no distension and no mass. There is no tenderness. There is no rebound and no guarding.  Musculoskeletal: Normal range of motion. He exhibits no edema.       Left knee: He exhibits swelling (warmth) and effusion. Tenderness found.  BL patellar abrasions with surrounding erythema  Neurological: He is alert and oriented to person, place, and time.  Skin: Skin is warm and dry.  Psychiatric: He has a normal mood and affect.    ED Course  Procedures (including critical care time) Labs Review Labs Reviewed  CBC WITH DIFFERENTIAL/PLATELET - Abnormal; Notable for the following:    WBC 14.6 (*)    MCHC 36.4 (*)    Neutrophils Relative % 78 (*)    Neutro Abs 11.4 (*)    Monocytes Absolute 1.3 (*)    All other components within normal limits  BASIC METABOLIC PANEL - Abnormal; Notable for the following:    Sodium 126 (*)    Chloride 91 (*)    Glucose, Bld 148 (*)    All other components within normal limits  TROPONIN I  APTT  PROTIME-INR  BASIC  METABOLIC PANEL  CBC  LIPID PANEL  I-STAT TROPOININ, ED    Imaging Review No results found.   EKG Interpretation   Date/Time:  Sunday January 29 2015 16:39:34 EDT Ventricular Rate:  86 PR Interval:  142 QRS Duration: 88 QT Interval:  373 QTC Calculation: 446 R Axis:   71 Text Interpretation:  Sinus rhythm Inferior infarct, acute (LCx) Baseline  wander in lead(s) V3 ** ** ACUTE MI / STEMI ** ** inferior leads, minor  STE V4,-6 No reciprocal changes Reconfirmed by DOCHERTY  MD, MEGAN ([id number])  on 01/29/2015 5:05:28 PM      MDM   Final diagnoses:  Pain  ST elevation myocardial infarction (STEMI), unspecified artery  Knee effusion, left  Cellulitis of leg without foot, left  Cellulitis of leg without foot, right   Pt is a 49 y.o. male with Pmhx as above who presents from home with onset of L lateral chest pain around 8:30 this morning w/o assoc symptoms lasting about 20 mins. Around noon, pt ate brunch, had sudden onset redness of face, nausea, and near  syncopal episode. GF reports he turned grey and had dec LOC, but no complete LOC. Pt denies assoc CP with episode and has no CP currently. EKG with new STE inferior leads (about 1mm) and to a lesser extent in V4,5,6 without reciprocal changes. The STEMI MD, Dr. Timmothy Euler was consulted, reviewed EKGs and agreed findings were concerning given hx of chest pain today, Code STEMI activated. Pt given ASA heparin bolus, will be transported to [facility] Cath lab. He has secondary complaint of R knee pain with effusion, and has what look like cellulitis around patellar scab. Would plan for arthrocentesis if not emergently transporting to the cath lab.       Toy Cookey, MD 01/29/15 2049

## 2015-01-29 NOTE — CV Procedure (Signed)
      Cardiac Catheterization Operative Report  Danelle EarthlyJohn K Huard [id number] 4/10/20166:03 PM No primary care provider on file.  Procedure Performed:  1. Left Heart Catheterization 2. Selective Coronary Angiography 3. Left ventricular angiogram  Operator: Verne Carrowhristopher McAlhany, MD  Arterial access site:  Right radial artery.   Indication: 49 yo male with history of HTN, HLD, tobacco abuse [facility] ED with c/o knee pain and mentioned that he had chest pain earlier in the day. His EKG showed 1 mm ST elevation in the inferior leads. Code STEMI activated.                                       Procedure Details: The risks, benefits, complications, treatment options, and expected outcomes were discussed with the patient. Emergency consent obtained. The patient was further sedated with Versed and Fentanyl. The right wrist was assessed with a modified Allens test which was positive. The right wrist was prepped and draped in a sterile fashion. 1% lidocaine was used for local anesthesia. Using the modified Seldinger access technique, a 5 French sheath was placed in the right radial artery. 3 mg Verapamil was given through the sheath. 4000 units IV heparin was given. Standard diagnostic catheters were used to perform selective coronary angiography. A pigtail catheter was used to perform a left ventricular angiogram. The sheath was removed from the right radial artery and a Terumo hemostasis band was applied at the arteriotomy site on the right wrist.    There were no immediate complications. The patient was taken to the recovery area in stable condition.   Hemodynamic Findings: Central aortic pressure: 101/67 Left ventricular pressure: 114/12/19  Angiographic Findings:  Left main: No obstructive disease.   Left Anterior Descending Artery: Large caliber vessel that courses to the apex. There is a moderate caliber diagonal branch. No obstructive disease.   Circumflex Artery: Large  caliber vessel with moderate caliber obtuse marginal branch. No obstructive disease.   Right Coronary Artery: Moderate caliber dominant vessel with 40% proximal stenosis.   Left Ventricular Angiogram: LVEF=65-70%.   Impression: 1. 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 Normal LV systolic function  Recommendations: Will admit to telemetry. Will continue ASA, statin and beta blocker. Complete cessation of tobacco. Discharge in am if stable. He can follow up with me.        Complications:  None. The patient tolerated the procedure well.

## 2015-01-29 NOTE — Progress Notes (Signed)
RN in ED asked that Chaplain come to take pt. Family to Cath Lab Waiting area.  After settling the family in to [facility] waiting area, family requested prayer.    Chaplain went to cath lab holding, where I was told the patient did not need intensive care post-procedure and was being taken to 3W.   I escorted family to 613W, where patient was being admitted to 3W02.  I  alerted RN that family was in waiting room.  Rev. IslandtonJan Hill, IowaChaplain [phone]

## 2015-01-29 NOTE — ED Notes (Signed)
IV x 2 started, [facility] transport to [facility].  Report to carelink given.  Pt did receive aspirin prior to transport.

## 2015-01-30 ENCOUNTER — Inpatient Hospital Stay (HOSPITAL_COMMUNITY): Payer: PRIVATE HEALTH INSURANCE

## 2015-01-30 ENCOUNTER — Encounter (HOSPITAL_COMMUNITY): Payer: Self-pay | Admitting: Cardiology

## 2015-01-30 DIAGNOSIS — R9431 Abnormal electrocardiogram [ECG] [EKG]: Secondary | ICD-10-CM

## 2015-01-30 DIAGNOSIS — I2510 Atherosclerotic heart disease of native coronary artery without angina pectoris: Secondary | ICD-10-CM

## 2015-01-30 DIAGNOSIS — R0789 Other chest pain: Secondary | ICD-10-CM

## 2015-01-30 DIAGNOSIS — Z720 Tobacco use: Secondary | ICD-10-CM

## 2015-01-30 HISTORY — DX: Atherosclerotic heart disease of native coronary artery without angina pectoris: I25.10

## 2015-01-30 LAB — BASIC METABOLIC PANEL
Anion gap: 6 (ref 5–15)
BUN: 6 mg/dL (ref 6–23)
CALCIUM: 9.2 mg/dL (ref 8.4–10.5)
CO2: 25 mmol/L (ref 19–32)
CREATININE: 0.8 mg/dL (ref 0.50–1.35)
Chloride: 102 mmol/L (ref 96–112)
GFR calc Af Amer: >90 mL/min (ref >90)
GFR calc non Af Amer: >90 mL/min (ref >90)
GLUCOSE: 97 mg/dL (ref 70–99)
Potassium: 4.7 mmol/L (ref 3.5–5.1)
Sodium: 133 mmol/L — ABNORMAL LOW (ref 135–145)

## 2015-01-30 LAB — LIPID PANEL
CHOLESTEROL: 141 mg/dL (ref 0–200)
HDL: 72 mg/dL (ref >39)
LDL Cholesterol: 60 mg/dL (ref 0–99)
TRIGLYCERIDES: 47 mg/dL (ref <150)
Total CHOL/HDL Ratio: 2 RATIO
VLDL: 9 mg/dL (ref 0–40)

## 2015-01-30 LAB — CBC
HCT: 45.8 % (ref 39.0–52.0)
Hemoglobin: 16.1 g/dL (ref 13.0–17.0)
MCH: 32.7 pg (ref 26.0–34.0)
MCHC: 35.2 g/dL (ref 30.0–36.0)
MCV: 92.9 fL (ref 78.0–100.0)
PLATELETS: 196 10*3/uL (ref 150–400)
RBC: 4.93 MIL/uL (ref 4.22–5.81)
RDW: 12.2 % (ref 11.5–15.5)
WBC: 7.8 10*3/uL (ref 4.0–10.5)

## 2015-01-30 MED ORDER — ASPIRIN 81 MG PO CHEW: 81.0000 mg | CHEWABLE_TABLET | Freq: Every day | ORAL | Status: AC

## 2015-01-30 NOTE — Progress Notes (Signed)
Utilization review completed. Natalio Salois, RN, BSN. 

## 2015-01-30 NOTE — Discharge Instructions (Signed)
Call [facility] [address] at [phone] if any bleeding, swelling or drainage at cath site.  May shower, no tub baths for 48 hours for groin sticks. No lifting over 5 pounds for 3 days.  No Driving for 3 days  See your primary MD about your knee.  Continue your current meds, we did add Asprin 81 mg daily.  Stop smoking.

## 2015-01-30 NOTE — Progress Notes (Signed)
Pt is ready for DC home accompanied by family. Pt reports he understands all DC medications, follow up appointments, and instructions.   Ulice Dashorie Armelia Penton, RCharity fundraiser

## 2015-01-30 NOTE — Progress Notes (Addendum)
Subjective:  no complaints, no chest pain   Objective: Vital signs in last 24 hours: Temp:  [98.3 F (36.8 C)-98.9 F (37.2 C)] 98.6 F (37 C) (04/11 0423) Pulse Rate:  [64-92] 73 (04/11 0749) Resp:  [16-20] 20 (04/11 0423) BP: (90-115)/(48-94) 110/67 mmHg (04/11 0749) SpO2:  [92 %-100 %] 100 % (04/11 0423) Weight:  [165 lb (74.844 kg)-171 lb 9 oz (77.82 kg)] 171 lb 9 oz (77.82 kg) (04/11 0423) Weight change:  Last BM Date: 01/29/15 Intake/Output from previous day: -1450  04/10 0701 - 04/11 0700 In: -  Out: 1450 [Urine:1450] Intake/Output this shift:    PE: General:Pleasant affect, NAD Skin:Warm and dry, brisk capillary refill HEENT:normocephalic, sclera clear, mucus membranes moist Heart:S1S2 RRR without murmur, gallup, rub or click Lungs:clear without rales, rhonchi, or wheezes EAV:WUJWAbd:soft, non tender, + BS, do not palpate liver spleen or masses Ext:no lower ext edema, 2+ pedal pulses, 2+ radial pulses, cath site without hematoma, lt knee with mild redness but no swelling, abrasions on both knees    Neuro:alert and oriented X 3, MAE, follows commands, + facial symmetry Tele: SR with pauses    Lab Results:  Recent Labs  01/29/15 1710 01/30/15 0507  WBC 14.6* 7.8  HGB 15.0 16.1  HCT 41.2 45.8  PLT 230 196   BMET  Recent Labs  01/29/15 1710 01/30/15 0507  NA 126* 133*  K 3.7 4.7  CL 91* 102  CO2 21 25  GLUCOSE 148* 97  BUN 7 6  CREATININE 0.64 0.80  CALCIUM 8.7 9.2    Recent Labs  01/29/15 1710  TROPONINI <0.03    Lab Results  Component Value Date   CHOL 141 01/30/2015   HDL 72 01/30/2015   LDLCALC 60 01/30/2015   TRIG 47 01/30/2015   CHOLHDL 2.0 01/30/2015   No results found for: HGBA1C   No results found for: TSH  Hepatic Function Panel No results for input(s): PROT, ALBUMIN, AST, ALT, ALKPHOS, BILITOT, BILIDIR, IBILI in the last 72 hours.  Recent Labs  01/30/15 0507  CHOL 141   No results for input(s): PROTIME in the  last 72 hours.     Studies/Results: No results found.  Medications: I have reviewed the patient's current medications. Scheduled Meds: . aspirin  81 mg Oral Daily  . atenolol  50 mg Oral Daily  . lisinopril  20 mg Oral Daily  . simvastatin  20 mg Oral Daily   Continuous Infusions:  PRN Meds:.acetaminophen, ondansetron (ZOFRAN) IV, oxyCODONE-acetaminophen'   Assessment/Plan: 49 yo male with history of HTN, HLD, tobacco abuse [facility] with c/o knee pain and mentioned that he had chest pain earlier in the day. His EKG showed 1 mm ST elevation in the inferior leads. Code STEMI activated.   Cardiac cath: 1. 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 Normal LV systolic function   Principal Problem:   Chest pain- non cardiac with non obstructive CAD, neg troponin, plan discharge home follow up with Dr. Clifton JamesMcalhany --SR with pauses  Active Problems:   Abnormal EKG   Essential hypertension   CAD in native artery, 40% RCA proximal stenosis, 01/29/15   Tobacco abuse- stop smoking   Knee pain- xrays pending, less edema mild erythema    Lipids with LDL 60    LOS: 1 day   Time spent with pt. :15 minutes. [facility] Hospital[facility]OLD,LAURA R  Nurse Practitioner Certified Pager [phone] or after 5pm and  on weekends call [phone] 01/30/2015, 8:06 AM   Attending Note:   The patient was seen and examined.  Agree with assessment and plan as noted above.  Changes made to the above note as needed.  Patient has minimal CAD by cath. No evidence of ACS Per Dr. Gibson Ramp recommendation, continue ASA. His lipids look ok - would continue low dose atorvastatin. Ok for DC today once his knee xray is back. I encouraged him to contact his medical doctor for further evaluation of this.  He needs to quit smoking.   Vesta Mixer, Montez Hageman., MD, [facility] 01/30/2015, 9:04 AM [address],  [address] Office [phone] Pager [phone]

## 2015-01-30 NOTE — Discharge Summary (Signed)
Physician Discharge Summary       Patient ID: Roger EarthlyJohn K Davis MRN: [id number] DOB/AGE: 50-Aug-1967 50 y.o.  Admit date: 01/29/2015 Discharge date: 01/30/2015 Primary Cardiologist:Dr. Clifton JamesMcAlhany    Discharge Diagnoses:  Principal Problem:   Chest pain, non cardiac Active Problems:   CAD in native artery, 40% RCA proximal stenosis, 01/29/15   Abnormal EKG   Essential hypertension   Tobacco abuse   Discharged Condition: good  Procedures: 01/29/15 Lt heart cath by Dr. Clifton JamesMcAlhany for ST Elevation  Hospital Course: 49 yo male with history of tobacco abuse, HTN with onset of chest pain this am. [facility] and EKG showed 1mm ST elevation in the inferior leads. No chest pain or SOB at this time. No prior cardiac disease. He has smoked 1ppd for 30 years.   Emergent cath was done and no acute CAD noted.  He had normal coronary arteries except for 40% prox stenosis of RCA.  EF 65-70%.  Felt to be non cardiac chest pain.    Pt did well, stable for d/c, seen and found stable by Dr. Elease HashimotoNahser.  His knee, which was swollen the night before is much improved.  He will follow up with his PCP for further treatment.     Consults: cardiology  Significant Diagnostic Studies:  BMET    Component Value Date/Time   NA 133* 01/30/2015 0507   K 4.7 01/30/2015 0507   CL 102 01/30/2015 0507   CO2 25 01/30/2015 0507   GLUCOSE 97 01/30/2015 0507   BUN 6 01/30/2015 0507   CREATININE 0.80 01/30/2015 0507   CALCIUM 9.2 01/30/2015 0507   GFRNONAA >90 01/30/2015 0507   GFRAA >90 01/30/2015 0507    CBC    Component Value Date/Time   WBC 7.8 01/30/2015 0507   RBC 4.93 01/30/2015 0507   HGB 16.1 01/30/2015 0507   HCT 45.8 01/30/2015 0507   PLT 196 01/30/2015 0507   MCV 92.9 01/30/2015 0507   MCH 32.7 01/30/2015 0507   MCHC 35.2 01/30/2015 0507   RDW 12.2 01/30/2015 0507   LYMPHSABS 1.8 01/29/2015 1710   MONOABS 1.3* 01/29/2015 1710   EOSABS 0.1 01/29/2015 1710   BASOSABS 0.0  01/29/2015 1710   Troponin <0.03    Lipid Panel     Component Value Date/Time   CHOL 141 01/30/2015 0507   TRIG 47 01/30/2015 0507   HDL 72 01/30/2015 0507   CHOLHDL 2.0 01/30/2015 0507   VLDL 9 01/30/2015 0507   LDLCALC 60 01/30/2015 0507     LEFT KNEE - COMPLETE 4+ VIEW COMPARISON: None. FINDINGS: No fracture of the proximal tibia or distal femur. Patella is normal. Trace joint effusion. Mild pre patellar swelling.  IMPRESSION: Trace suprapatellar joint effusion. Mild prepatellar soft tissue thickening. No fracture or dislocation.  CHEST 1 VIEW COMPARISON: Radiograph 10/01/2004. FINDINGS: Normal mediastinum and cardiac silhouette. Normal pulmonary vasculature. No evidence of effusion, infiltrate, or pneumothorax. No acute bony abnormality.  IMPRESSION: Normal chest radiograph.    Discharge Exam: Blood pressure 110/67, pulse 73, temperature 98.6 F (37 C), temperature source Oral, resp. rate 20, height 5\' 10"  (1.778 m), weight 171 lb 9 oz (77.82 kg), SpO2 100 %.   Disposition: 01-Home or Self Care     Medication List    STOP taking these medications        clindamycin 300 MG capsule  Commonly known as:  CLEOCIN      TAKE these medications  aspirin 81 MG chewable tablet  Chew 1 tablet (81 mg total) by mouth daily.     atenolol 50 MG tablet  Commonly known as:  TENORMIN  Take 50 mg by mouth daily.     lisinopril 20 MG tablet  Commonly known as:  PRINIVIL,ZESTRIL  Take 20 mg by mouth daily.     naproxen 500 MG tablet  Commonly known as:  NAPROSYN  Take 1 tablet (500 mg total) by mouth 2 (two) times daily as needed for mild pain, moderate pain or headache (TAKE WITH MEALS.).     oxyCODONE-acetaminophen 5-325 MG per tablet  Commonly known as:  PERCOCET  Take 1-2 tablets by mouth every 6 (six) hours as needed for severe pain.     simvastatin 20 MG tablet  Commonly known as:  ZOCOR  Take 20 mg by mouth daily.       Follow-up  Information    Follow up with Verne Carrow, MD On 03/06/2015.   Specialty:  Cardiology   Why:  at 8:15 with Wynell Balloon, PA   Contact information:   [address] [place] [address] [phone]        Discharge Instructions: Call [facility] at [phone] if any bleeding, swelling or drainage at cath site.  May shower, no tub baths for 48 hours for groin sticks. No lifting over 5 pounds for 3 days.  No Driving for 3 days  See your primary MD about your knee.  Continue your current meds, we did add Asprin 81 mg daily.  Stop smoking.      Signed: Leone Brand Nurse Practitioner-Certified [facility]: [facility] 01/30/2015, 12:18 PM  Time spent on discharge : >30 minutes.    Attending Note:   The patient was seen and examined.  Agree with assessment and plan as noted above.  Changes made to the above note as needed.  See my note from earlier today.    Vesta Mixer, Montez Hageman., MD, [facility] 01/30/2015, 5:37 PM [address]  [address] Office [phone] Pager [phone]

## 2015-03-06 ENCOUNTER — Encounter: Payer: PRIVATE HEALTH INSURANCE | Admitting: Physician Assistant

## 2015-03-10 ENCOUNTER — Encounter: Payer: Self-pay | Admitting: Physician Assistant

## 2015-11-12 IMAGING — CR DG KNEE COMPLETE 4+V*L*
4 series · 4 of 4 positions shown · non-contrast
Comparison: None.

CLINICAL DATA: Short of breath, knee pain.  Knee swelling.

EXAM:
LEFT KNEE - COMPLETE 4+ VIEW

[knee ap]
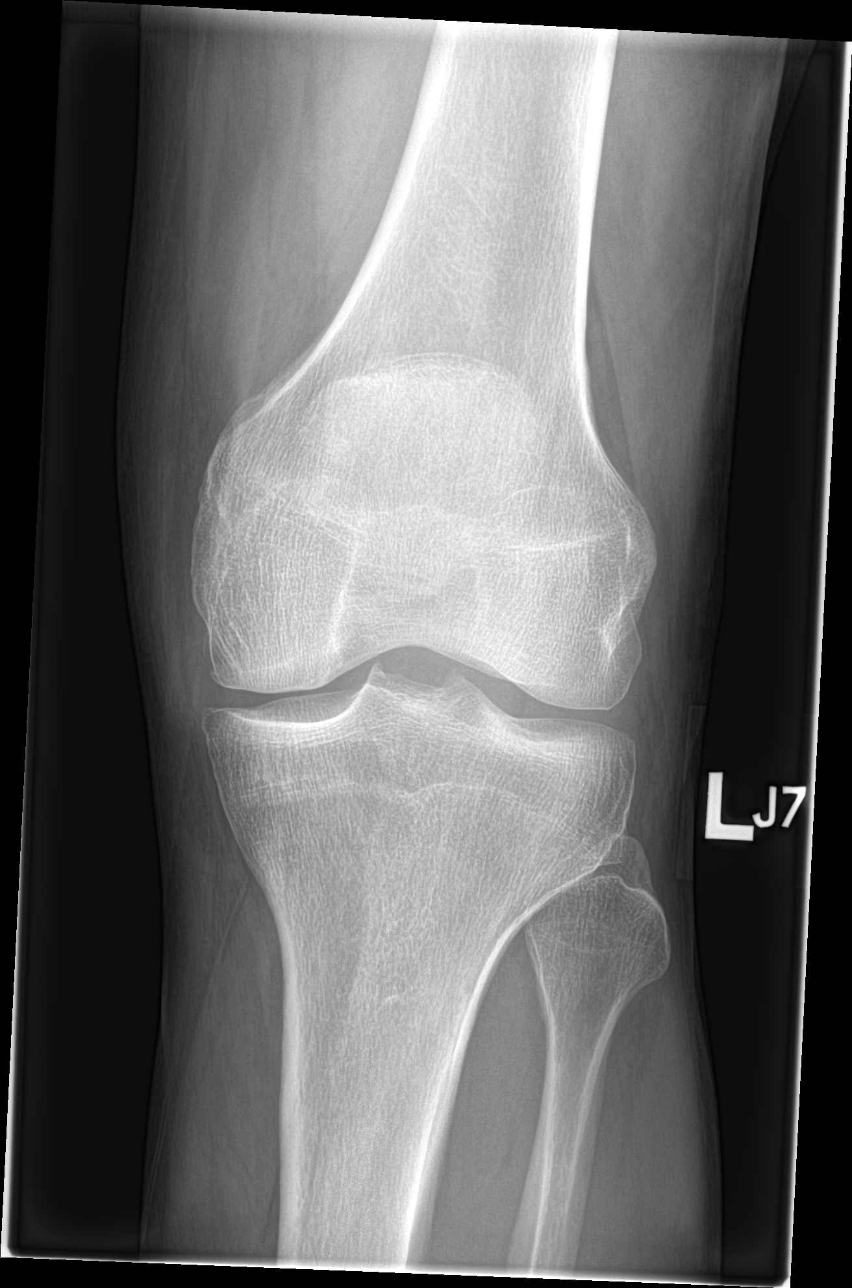

[knee obl (1 of 2)]
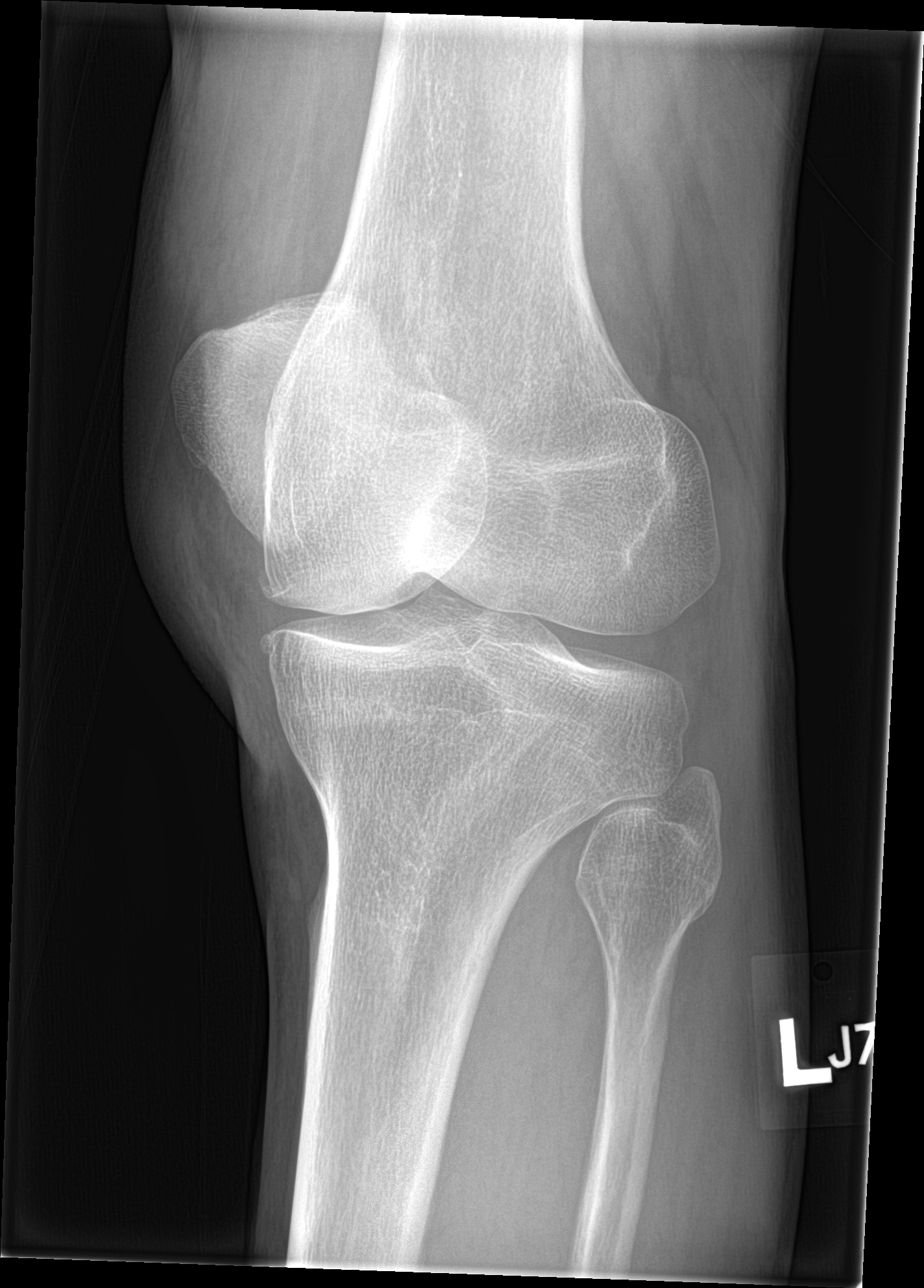

[knee obl (2 of 2)]
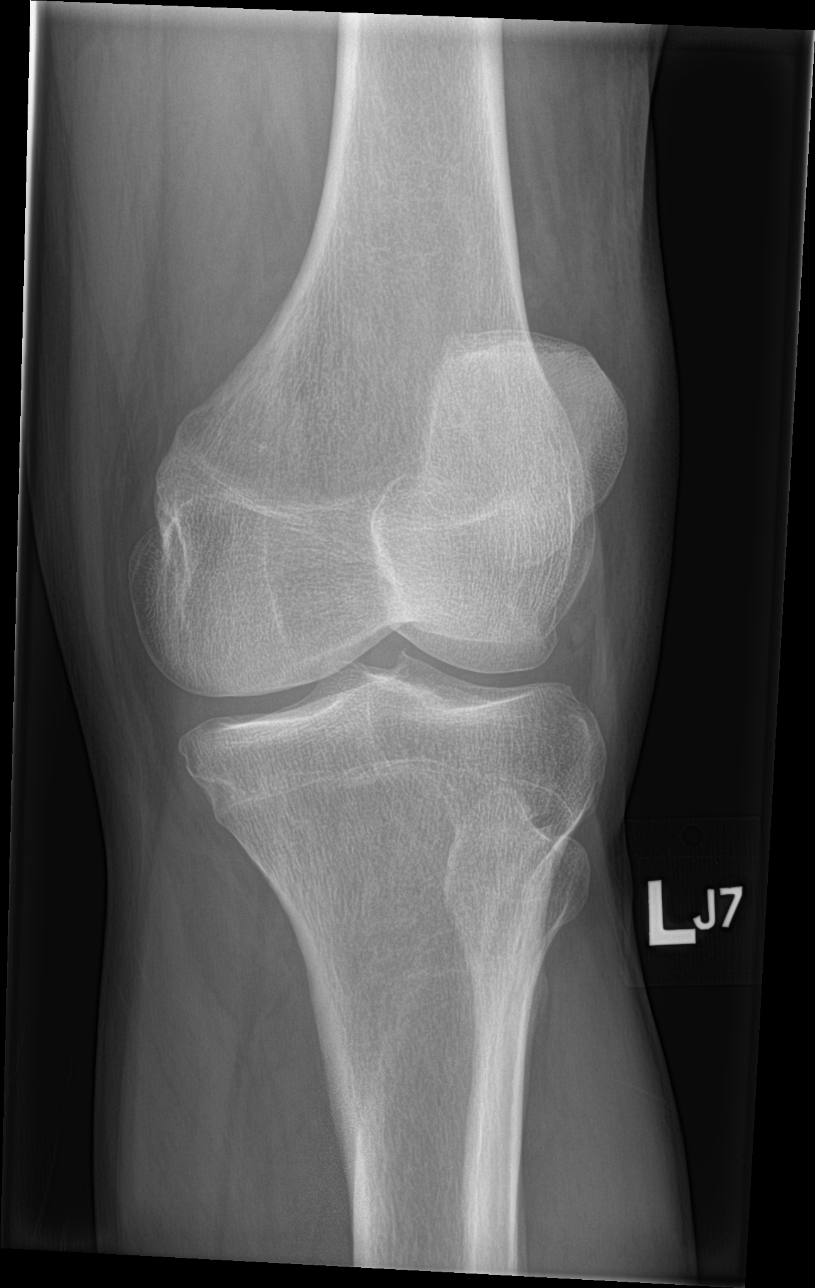

[knee lat]
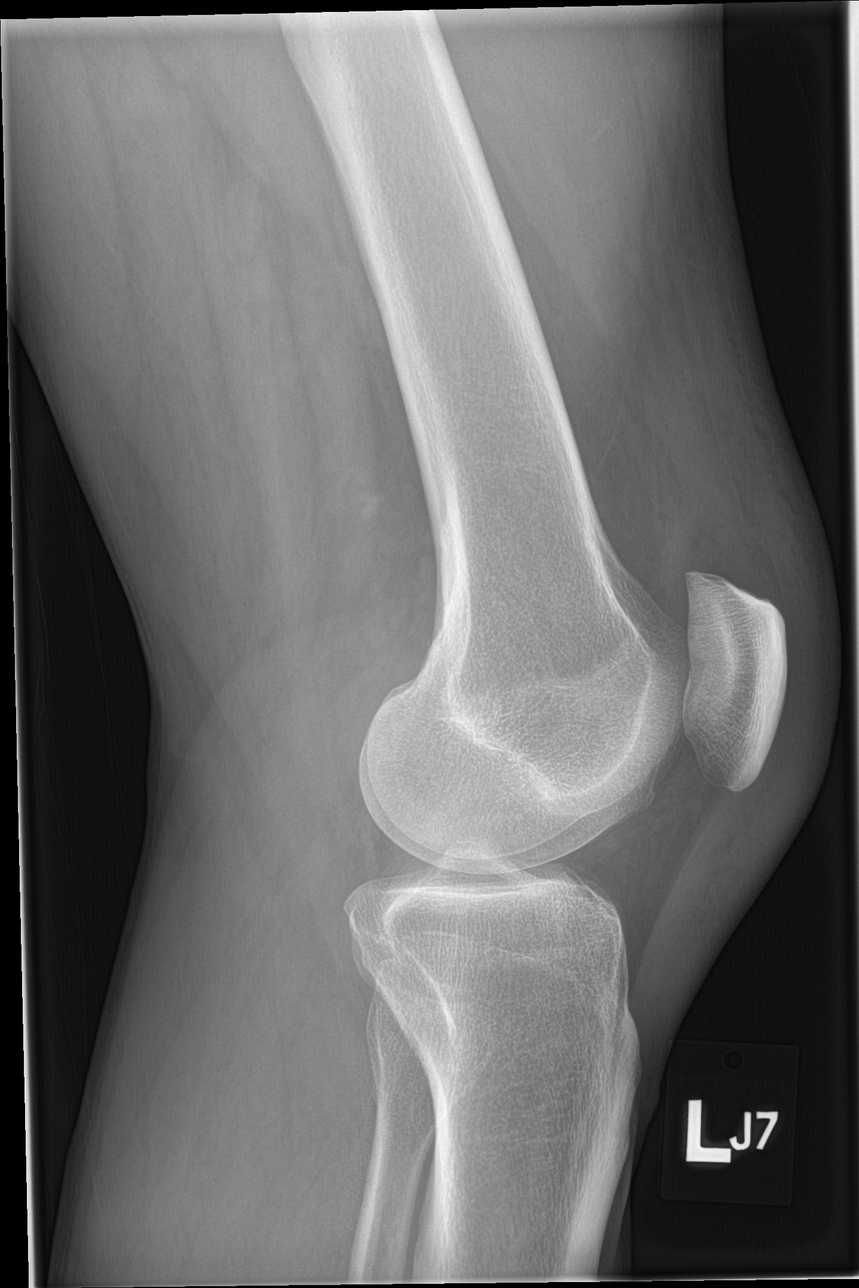

[4 of 4 positions shown; findings below may reference images not displayed]

FINDINGS: No fracture of the proximal tibia or distal femur. Patella is
normal. Trace joint effusion. Mild pre patellar swelling.
IMPRESSION: Trace suprapatellar joint effusion. Mild prepatellar soft tissue
thickening. No fracture or dislocation.

## 2015-12-05 MED FILL — LISINOPRIL 20 MG TABLET: 20 | 30 days supply | Qty: 30 | Fill #0

## 2016-03-06 MED FILL — SIMVASTATIN 20 MG TABLET: 20 | 90 days supply | Qty: 90 | Fill #0

## 2016-03-06 MED FILL — ATENOLOL 50 MG TABLET: 50 | 90 days supply | Qty: 90 | Fill #0

## 2016-03-06 MED FILL — LISINOPRIL 20 MG TABLET: 20 | 30 days supply | Qty: 30 | Fill #0

## 2016-05-30 MED FILL — ATENOLOL 50 MG TABLET: 50 | 30 days supply | Qty: 30 | Fill #1

## 2016-08-09 MED FILL — ATENOLOL 50 MG TABLET: 50 | 30 days supply | Qty: 30 | Fill #2

## 2016-09-20 MED FILL — LISINOPRIL 20 MG TABLET: 20 | 30 days supply | Qty: 30 | Fill #1

## 2016-09-20 MED FILL — SIMVASTATIN 20 MG TABLET: 20 | 30 days supply | Qty: 30 | Fill #1

## 2016-09-20 MED FILL — ATENOLOL 50 MG TABLET: 50 | 30 days supply | Qty: 30 | Fill #3

## 2016-10-29 MED FILL — LISINOPRIL 20 MG TABLET: 20 | 30 days supply | Qty: 30 | Fill #2

## 2016-10-29 MED FILL — SIMVASTATIN 20 MG TABLET: 20 | 30 days supply | Qty: 30 | Fill #2

## 2016-10-29 MED FILL — ATENOLOL 50 MG TABLET: 50 | 30 days supply | Qty: 30 | Fill #4

## 2016-12-03 MED FILL — SIMVASTATIN 20 MG TABLET: 20 | 30 days supply | Qty: 30 | Fill #3

## 2016-12-03 MED FILL — LISINOPRIL 20 MG TABLET: 20 | 30 days supply | Qty: 30 | Fill #3

## 2016-12-03 MED FILL — ATENOLOL 50 MG TABLET: 50 | 30 days supply | Qty: 30 | Fill #5

## 2017-01-06 MED FILL — ATENOLOL 50 MG TABLET: 50 | 30 days supply | Qty: 30 | Fill #6

## 2017-01-06 MED FILL — SIMVASTATIN 20 MG TABLET: 20 | 30 days supply | Qty: 30 | Fill #4

## 2017-01-06 MED FILL — LISINOPRIL 20 MG TABLET: 20 | 30 days supply | Qty: 30 | Fill #4

## 2017-02-03 MED FILL — ATENOLOL 50 MG TABLET: 50 | 30 days supply | Qty: 30 | Fill #0

## 2017-02-03 MED FILL — SIMVASTATIN 20 MG TABLET: 20 | 30 days supply | Qty: 30 | Fill #5

## 2017-02-03 MED FILL — LISINOPRIL 20 MG TAB: 20 | 30 days supply | Qty: 30 | Fill #5

## 2017-03-06 MED FILL — ATENOLOL 50 MG TABLET: 50 | 30 days supply | Qty: 30 | Fill #1

## 2017-03-06 MED FILL — SIMVASTATIN 20 MG TABLET: 20 | 30 days supply | Qty: 30 | Fill #6

## 2017-03-06 MED FILL — LISINOPRIL 20 MG TAB: 20 | 30 days supply | Qty: 30 | Fill #0

## 2017-03-31 MED FILL — ATENOLOL 50 MG TABLET: 50 | 30 days supply | Qty: 30 | Fill #2

## 2017-04-01 MED FILL — LISINOPRIL 20 MG TAB: 20 | 30 days supply | Qty: 30 | Fill #0

## 2017-04-01 MED FILL — SIMVASTATIN 20 MG TABLET: 20 | 30 days supply | Qty: 30 | Fill #0

## 2017-04-27 MED FILL — ATENOLOL 50 MG TABLET: 50 | 30 days supply | Qty: 30 | Fill #3

## 2017-04-28 MED FILL — SIMVASTATIN 20 MG TABLET: 20 | 30 days supply | Qty: 30 | Fill #1

## 2017-04-28 MED FILL — LISINOPRIL 20 MG TAB: 20 | 30 days supply | Qty: 30 | Fill #1

## 2017-05-24 MED FILL — ATENOLOL 50 MG TABLET: 50 | 30 days supply | Qty: 30 | Fill #4

## 2017-05-26 MED FILL — SIMVASTATIN 20 MG TABLET: 20 | 30 days supply | Qty: 30 | Fill #2

## 2017-05-26 MED FILL — LISINOPRIL 20 MG TAB: 20 | 30 days supply | Qty: 30 | Fill #2

## 2017-06-26 MED FILL — ATENOLOL 50 MG TABLET: 50 | 30 days supply | Qty: 30 | Fill #5

## 2017-06-26 MED FILL — SIMVASTATIN 20 MG TABLET: 20 | 30 days supply | Qty: 30 | Fill #3

## 2017-06-26 MED FILL — LISINOPRIL 20 MG TAB: 20 | 30 days supply | Qty: 30 | Fill #3

## 2017-07-25 MED FILL — ATENOLOL 50 MG TABLET: 50 | 30 days supply | Qty: 30 | Fill #6

## 2017-07-25 MED FILL — SIMVASTATIN 20 MG TABLET: 20 | 30 days supply | Qty: 30 | Fill #4

## 2017-07-25 MED FILL — LISINOPRIL 20 MG TAB: 20 | 30 days supply | Qty: 30 | Fill #4

## 2017-08-27 MED FILL — ATENOLOL 50 MG TABLET: 50 | 30 days supply | Qty: 30 | Fill #7

## 2017-08-27 MED FILL — LISINOPRIL 20 MG TAB: 20 | 30 days supply | Qty: 30 | Fill #5

## 2017-08-27 MED FILL — SIMVASTATIN 20 MG TABLET: 20 | 30 days supply | Qty: 30 | Fill #5

## 2017-09-25 MED FILL — SIMVASTATIN 20 MG TABLET: 20 | 30 days supply | Qty: 30 | Fill #6

## 2017-09-25 MED FILL — LISINOPRIL 20 MG TABLET: 20 | 30 days supply | Qty: 30 | Fill #0

## 2017-09-25 MED FILL — ATENOLOL 50 MG TABLET: 50 | 30 days supply | Qty: 30 | Fill #8

## 2017-10-26 MED FILL — SIMVASTATIN 20 MG TABLET: 20 | 30 days supply | Qty: 30 | Fill #7

## 2017-10-26 MED FILL — LISINOPRIL 20 MG TABLET: 20 | 30 days supply | Qty: 30 | Fill #1

## 2017-10-27 MED FILL — ATENOLOL 50 MG TABLET: 50 | 30 days supply | Qty: 30 | Fill #0

## 2017-11-25 MED FILL — ATENOLOL 50 MG TABLET: 50 | 30 days supply | Qty: 30 | Fill #1

## 2017-11-25 MED FILL — LISINOPRIL 20 MG TABLET: 20 | 30 days supply | Qty: 30 | Fill #0

## 2017-11-25 MED FILL — SIMVASTATIN 20 MG TABS: 20 | 30 days supply | Qty: 30 | Fill #8

## 2018-01-06 MED FILL — ATENOLOL 50 MG TABLET: 50 | 30 days supply | Qty: 30 | Fill #2

## 2018-03-10 ENCOUNTER — Other Ambulatory Visit: Payer: Self-pay | Admitting: Family Medicine

## 2018-03-10 DIAGNOSIS — R945 Abnormal results of liver function studies: Principal | ICD-10-CM

## 2018-03-10 DIAGNOSIS — R7989 Other specified abnormal findings of blood chemistry: Secondary | ICD-10-CM

## 2018-03-20 ENCOUNTER — Other Ambulatory Visit: Payer: Self-pay

## 2018-03-27 ENCOUNTER — Other Ambulatory Visit: Payer: Self-pay

## 2018-05-28 ENCOUNTER — Other Ambulatory Visit: Payer: Self-pay

## 2018-12-03 DIAGNOSIS — E782 Mixed hyperlipidemia: Secondary | ICD-10-CM | POA: Diagnosis not present

## 2018-12-03 DIAGNOSIS — F1010 Alcohol abuse, uncomplicated: Secondary | ICD-10-CM | POA: Diagnosis not present

## 2018-12-03 DIAGNOSIS — I10 Essential (primary) hypertension: Secondary | ICD-10-CM | POA: Diagnosis not present

## 2018-12-03 DIAGNOSIS — Z23 Encounter for immunization: Secondary | ICD-10-CM | POA: Diagnosis not present

## 2018-12-03 DIAGNOSIS — Z720 Tobacco use: Secondary | ICD-10-CM | POA: Diagnosis not present

## 2019-08-09 DIAGNOSIS — F1010 Alcohol abuse, uncomplicated: Secondary | ICD-10-CM | POA: Diagnosis not present

## 2019-08-09 DIAGNOSIS — I10 Essential (primary) hypertension: Secondary | ICD-10-CM | POA: Diagnosis not present

## 2019-08-09 DIAGNOSIS — E782 Mixed hyperlipidemia: Secondary | ICD-10-CM | POA: Diagnosis not present

## 2019-08-09 DIAGNOSIS — Z23 Encounter for immunization: Secondary | ICD-10-CM | POA: Diagnosis not present

## 2019-08-09 DIAGNOSIS — Z720 Tobacco use: Secondary | ICD-10-CM | POA: Diagnosis not present

## 2019-08-09 DIAGNOSIS — Z125 Encounter for screening for malignant neoplasm of prostate: Secondary | ICD-10-CM | POA: Diagnosis not present

## 2020-03-10 DIAGNOSIS — E782 Mixed hyperlipidemia: Secondary | ICD-10-CM | POA: Diagnosis not present

## 2020-03-10 DIAGNOSIS — Z0000 Encounter for general adult medical examination without abnormal findings: Secondary | ICD-10-CM | POA: Diagnosis not present

## 2020-03-10 DIAGNOSIS — I10 Essential (primary) hypertension: Secondary | ICD-10-CM | POA: Diagnosis not present

## 2020-03-10 DIAGNOSIS — Z125 Encounter for screening for malignant neoplasm of prostate: Secondary | ICD-10-CM | POA: Diagnosis not present

## 2022-06-08 NOTE — Progress Notes (Signed)
Erroneous encounter-disregard

## 2022-06-17 ENCOUNTER — Encounter: Payer: Self-pay | Admitting: Family Medicine

## 2022-06-17 ENCOUNTER — Encounter: Payer: Managed Care, Other (non HMO) | Admitting: Family

## 2022-06-17 ENCOUNTER — Ambulatory Visit: Payer: 59 | Admitting: Family Medicine

## 2022-06-17 VITALS — BP 128/85 | HR 94 | Temp 97.7°F | Resp 16 | Ht 67.0 in | Wt 146.0 lb

## 2022-06-17 DIAGNOSIS — Z1159 Encounter for screening for other viral diseases: Secondary | ICD-10-CM

## 2022-06-17 DIAGNOSIS — Z13220 Encounter for screening for lipoid disorders: Secondary | ICD-10-CM

## 2022-06-17 DIAGNOSIS — Z0000 Encounter for general adult medical examination without abnormal findings: Secondary | ICD-10-CM | POA: Diagnosis not present

## 2022-06-17 DIAGNOSIS — Z7689 Persons encountering health services in other specified circumstances: Secondary | ICD-10-CM | POA: Diagnosis not present

## 2022-06-17 DIAGNOSIS — Z130 Encounter for screening for diseases of the blood and blood-forming organs and certain disorders involving the immune mechanism: Secondary | ICD-10-CM

## 2022-06-17 DIAGNOSIS — Z114 Encounter for screening for human immunodeficiency virus [HIV]: Secondary | ICD-10-CM

## 2022-06-17 MED ORDER — LISINOPRIL 20 MG PO TABS: 20.0000 mg | ORAL_TABLET | Freq: Every day | ORAL | 1 refills | Status: DC

## 2022-06-17 MED ORDER — ATENOLOL 50 MG PO TABS: 50.0000 mg | ORAL_TABLET | Freq: Every day | ORAL | 1 refills | Status: DC

## 2022-06-17 MED ORDER — SIMVASTATIN 20 MG PO TABS: 20.0000 mg | ORAL_TABLET | Freq: Every day | ORAL | 1 refills | Status: DC

## 2022-06-17 NOTE — Progress Notes (Unsigned)
Patient is here to established care. Patient has no other concerns today 

## 2022-06-18 ENCOUNTER — Encounter: Payer: Self-pay | Admitting: Family Medicine

## 2022-06-18 LAB — HIV ANTIBODY (ROUTINE TESTING W REFLEX): HIV Screen 4th Generation wRfx: NONREACTIVE

## 2022-06-18 LAB — CBC WITH DIFFERENTIAL/PLATELET
Basophils Absolute: 0.1 10*3/uL (ref 0.0–0.2)
Basos: 1 %
EOS (ABSOLUTE): 0.1 10*3/uL (ref 0.0–0.4)
Eos: 2 %
Hematocrit: 44.2 % (ref 37.5–51.0)
Hemoglobin: 15.6 g/dL (ref 13.0–17.7)
Immature Grans (Abs): 0 10*3/uL (ref 0.0–0.1)
Immature Granulocytes: 0 %
Lymphocytes Absolute: 1.7 10*3/uL (ref 0.7–3.1)
Lymphs: 34 %
MCH: 35.6 pg — ABNORMAL HIGH (ref 26.6–33.0)
MCHC: 35.3 g/dL (ref 31.5–35.7)
MCV: 101 fL — ABNORMAL HIGH (ref 79–97)
Monocytes Absolute: 0.4 10*3/uL (ref 0.1–0.9)
Monocytes: 9 %
Neutrophils Absolute: 2.7 10*3/uL (ref 1.4–7.0)
Neutrophils: 54 %
Platelets: 229 10*3/uL (ref 150–450)
RBC: 4.38 x10E6/uL (ref 4.14–5.80)
RDW: 12.2 % (ref 11.6–15.4)
WBC: 5 10*3/uL (ref 3.4–10.8)

## 2022-06-18 LAB — LIPID PANEL
Chol/HDL Ratio: 1.8 ratio (ref 0.0–5.0)
Cholesterol, Total: 154 mg/dL (ref 100–199)
HDL: 88 mg/dL (ref >39)
LDL Chol Calc (NIH): 39 mg/dL (ref 0–99)
Triglycerides: 172 mg/dL — ABNORMAL HIGH (ref 0–149)
VLDL Cholesterol Cal: 27 mg/dL (ref 5–40)

## 2022-06-18 LAB — HEMOGLOBIN A1C
Est. average glucose Bld gHb Est-mCnc: 100 mg/dL
Hgb A1c MFr Bld: 5.1 % (ref 4.8–5.6)

## 2022-06-18 LAB — CMP14+EGFR
ALT: 18 IU/L (ref 0–44)
AST: 32 IU/L (ref 0–40)
Albumin/Globulin Ratio: 2.3 — ABNORMAL HIGH (ref 1.2–2.2)
Albumin: 4.3 g/dL (ref 3.8–4.9)
Alkaline Phosphatase: 90 IU/L (ref 44–121)
BUN/Creatinine Ratio: 7 — ABNORMAL LOW (ref 9–20)
BUN: 4 mg/dL — ABNORMAL LOW (ref 6–24)
Bilirubin Total: 0.5 mg/dL (ref 0.0–1.2)
CO2: 24 mmol/L (ref 20–29)
Calcium: 8.8 mg/dL (ref 8.7–10.2)
Chloride: 98 mmol/L (ref 96–106)
Creatinine, Ser: 0.6 mg/dL — ABNORMAL LOW (ref 0.76–1.27)
Globulin, Total: 1.9 g/dL (ref 1.5–4.5)
Glucose: 88 mg/dL (ref 70–99)
Potassium: 4.2 mmol/L (ref 3.5–5.2)
Sodium: 137 mmol/L (ref 134–144)
Total Protein: 6.2 g/dL (ref 6.0–8.5)
eGFR: 113 mL/min/{1.73_m2} (ref >59)

## 2022-06-18 LAB — HEPATITIS C ANTIBODY: Hep C Virus Ab: NONREACTIVE

## 2022-06-18 LAB — TSH: TSH: 0.966 u[IU]/mL (ref 0.450–4.500)

## 2022-06-18 NOTE — Progress Notes (Signed)
New Patient Office Visit  Subjective    Patient ID: Roger Davis, male    DOB: 1965/12/24  Age: 56 y.o. MRN: [id number]  CC:  Chief Complaint  Patient presents with   Establish Care    HPI EDIN KON presents to establish care and for routine annual exam. Patient denies acute complaints or concerns.    Outpatient Encounter Medications as of 06/17/2022  Medication Sig   [DISCONTINUED] atenolol (TENORMIN) 50 MG tablet Take 50 mg by mouth daily.   [DISCONTINUED] lisinopril (PRINIVIL,ZESTRIL) 20 MG tablet Take 20 mg by mouth daily.   [DISCONTINUED] simvastatin (ZOCOR) 20 MG tablet Take 20 mg by mouth daily.   aspirin 81 MG chewable tablet Chew 1 tablet (81 mg total) by mouth daily. (Patient not taking: Reported on 06/17/2022)   atenolol (TENORMIN) 50 MG tablet Take 1 tablet (50 mg total) by mouth daily.   lisinopril (ZESTRIL) 20 MG tablet Take 1 tablet (20 mg total) by mouth daily.   naproxen (NAPROSYN) 500 MG tablet Take 1 tablet (500 mg total) by mouth 2 (two) times daily as needed for mild pain, moderate pain or headache (TAKE WITH MEALS.). (Patient not taking: Reported on 01/29/2015)   oxyCODONE-acetaminophen (PERCOCET) 5-325 MG per tablet Take 1-2 tablets by mouth every 6 (six) hours as needed for severe pain. (Patient not taking: Reported on 01/29/2015)   simvastatin (ZOCOR) 20 MG tablet Take 1 tablet (20 mg total) by mouth daily.   No facility-administered encounter medications on file as of 06/17/2022.    Past Medical History:  Diagnosis Date   CAD in native artery, 40% RCA proximal stenosis, 01/29/15 01/30/2015   Hypertension    Tobacco abuse     Past Surgical History:  Procedure Laterality Date   LEFT HEART CATHETERIZATION WITH CORONARY ANGIOGRAM N/A 01/29/2015   Procedure: LEFT HEART CATHETERIZATION WITH CORONARY ANGIOGRAM;  Surgeon: Burnell Blanks, MD;  Location: [facility] CATH LAB;  Service: Cardiovascular;  Laterality: N/A;   SHOULDER SURGERY      Family  History  Problem Relation Age of Onset   Diabetes Father     Social History   Socioeconomic History   Marital status: Married    Spouse name: Not on file   Number of children: Not on file   Years of education: Not on file   Highest education level: Not on file  Occupational History   Not on file  Tobacco Use   Smoking status: Every Day    Packs/day: 1.00    Years: 30.00    Total pack years: 30.00    Types: Cigarettes   Smokeless tobacco: Not on file  Substance and Sexual Activity   Alcohol use: Yes    Alcohol/week: 0.0 standard drinks of alcohol   Drug use: No   Sexual activity: Not on file  Other Topics Concern   Not on file  Social History Narrative   Not on file   Social Determinants of Health   Financial Resource Strain: Not on file  Food Insecurity: Not on file  Transportation Needs: Not on file  Physical Activity: Not on file  Stress: Not on file  Social Connections: Not on file  Intimate Partner Violence: Not on file    Review of Systems  All other systems reviewed and are negative.       Objective    BP 128/85   Pulse 94   Temp 97.7 F (36.5 C) (Oral)   Resp 16   Ht 5' 7"  (1.702  m)   Wt 146 lb (66.2 kg)   SpO2 96%   BMI 22.87 kg/m   Physical Exam Vitals and nursing note reviewed.  Constitutional:      General: He is not in acute distress.    Comments: Patient smells of alcohol   HENT:     Head: Normocephalic and atraumatic.     Right Ear: Tympanic membrane, ear canal and external ear normal.     Left Ear: Tympanic membrane, ear canal and external ear normal.     Nose: Nose normal.     Mouth/Throat:     Mouth: Mucous membranes are moist.     Pharynx: Oropharynx is clear.  Eyes:     Conjunctiva/sclera: Conjunctivae normal.     Pupils: Pupils are equal, round, and reactive to light.  Neck:     Thyroid: No thyromegaly.  Cardiovascular:     Rate and Rhythm: Normal rate and regular rhythm.     Heart sounds: Normal heart sounds. No  murmur heard. Pulmonary:     Effort: Pulmonary effort is normal.     Breath sounds: Normal breath sounds.  Abdominal:     General: There is no distension.     Palpations: Abdomen is soft. There is no mass.     Tenderness: There is no abdominal tenderness.     Hernia: There is no hernia in the left inguinal area or right inguinal area.  Genitourinary:    Penis: Normal and circumcised.      Testes: Normal.  Musculoskeletal:        General: Normal range of motion.     Cervical back: Normal range of motion and neck supple.     Right lower leg: No edema.     Left lower leg: No edema.  Skin:    General: Skin is warm and dry.  Neurological:     General: No focal deficit present.     Mental Status: He is alert and oriented to person, place, and time. Mental status is at baseline.  Psychiatric:        Mood and Affect: Mood normal.        Behavior: Behavior normal.         Assessment & Plan:   1. Annual physical exam  - CMP14+EGFR  2. Screening for deficiency anemia  - CBC with Differential  3. Screening for lipid disorders  - Lipid Panel  4. Screening for endocrine/metabolic/immunity disorders  - Hemoglobin A1c - TSH  5. Screening for HIV (human immunodeficiency virus)  - HIV antibody (with reflex)  6. Need for hepatitis C screening test  - Hepatitis C Antibody  7. Encounter to establish care     Return in about 6 months (around 12/18/2022) for follow up.   Becky Sax, MD

## 2023-04-12 ENCOUNTER — Other Ambulatory Visit: Payer: Self-pay | Admitting: Family Medicine

## 2023-04-14 NOTE — Telephone Encounter (Signed)
Patient needs OV for additional refills.  Requested Prescriptions  Pending Prescriptions Disp Refills   lisinopril (ZESTRIL) 20 MG tablet [Pharmacy Med Name: LISINOPRIL 20 MG TABLET] 90 tablet 0    Sig: TAKE 1 TABLET BY MOUTH EVERY DAY     Cardiovascular:  ACE Inhibitors Failed - 04/12/2023 12:15 AM      Failed - Cr in normal range and within 180 days    Creatinine, Ser  Date Value Ref Range Status  06/17/2022 0.60 (L) 0.76 - 1.27 mg/dL Final         Failed - K in normal range and within 180 days    Potassium  Date Value Ref Range Status  06/17/2022 4.2 3.5 - 5.2 mmol/L Final         Failed - Valid encounter within last 6 months    Recent Outpatient Visits           10 months ago Annual physical exam   [facility], Lauris Poag, MD              Passed - Patient is not pregnant      Passed - Last BP in normal range    BP Readings from Last 1 Encounters:  06/17/22 128/85          atenolol (TENORMIN) 50 MG tablet [Pharmacy Med Name: ATENOLOL 50 MG TABLET] 90 tablet 0    Sig: TAKE 1 TABLET BY MOUTH EVERY DAY     Cardiovascular: Beta Blockers 2 Failed - 04/12/2023 12:15 AM      Failed - Cr in normal range and within 360 days    Creatinine, Ser  Date Value Ref Range Status  06/17/2022 0.60 (L) 0.76 - 1.27 mg/dL Final         Failed - Valid encounter within last 6 months    Recent Outpatient Visits           10 months ago Annual physical exam   [facility], Lauris Poag, MD              Passed - Last BP in normal range    BP Readings from Last 1 Encounters:  06/17/22 128/85         Passed - Last Heart Rate in normal range    Pulse Readings from Last 1 Encounters:  06/17/22 94          simvastatin (ZOCOR) 20 MG tablet [Pharmacy Med Name: SIMVASTATIN 20 MG TABLET] 90 tablet 0    Sig: TAKE 1 TABLET BY MOUTH EVERY DAY     Cardiovascular:  Antilipid - Statins Failed - 04/12/2023 12:15 AM       Failed - Lipid Panel in normal range within the last 12 months    Cholesterol, Total  Date Value Ref Range Status  06/17/2022 154 100 - 199 mg/dL Final   LDL Chol Calc (NIH)  Date Value Ref Range Status  06/17/2022 39 0 - 99 mg/dL Final   HDL  Date Value Ref Range Status  06/17/2022 88 >39 mg/dL Final   Triglycerides  Date Value Ref Range Status  06/17/2022 172 (H) 0 - 149 mg/dL Final         Passed - Patient is not pregnant      Passed - Valid encounter within last 12 months    Recent Outpatient Visits           10 months ago Annual physical exam  [facility] [place] [facility] [place], MD

## 2023-07-26 ENCOUNTER — Other Ambulatory Visit: Payer: Self-pay | Admitting: Family Medicine

## 2023-08-03 ENCOUNTER — Other Ambulatory Visit: Payer: Self-pay | Admitting: Family Medicine

## 2023-08-04 ENCOUNTER — Encounter: Payer: Self-pay | Admitting: Family Medicine

## 2023-08-06 ENCOUNTER — Other Ambulatory Visit: Payer: Self-pay | Admitting: Family Medicine

## 2023-08-06 ENCOUNTER — Other Ambulatory Visit: Payer: Self-pay

## 2023-08-06 DIAGNOSIS — Z13220 Encounter for screening for lipoid disorders: Secondary | ICD-10-CM

## 2023-08-28 ENCOUNTER — Other Ambulatory Visit: Payer: Self-pay | Admitting: Family Medicine
